# Patient Record
Sex: Female | Born: 1950 | Race: White | Hispanic: No | Smoking: Current every day smoker
Health system: Southern US, Community
[De-identification: ages and names within clinical notes are randomized; demographics above are authoritative.]

## PROBLEM LIST (undated history)

## (undated) DIAGNOSIS — K219 Gastro-esophageal reflux disease without esophagitis: Secondary | ICD-10-CM

## (undated) DIAGNOSIS — F32A Depression, unspecified: Secondary | ICD-10-CM

## (undated) DIAGNOSIS — Z8709 Personal history of other diseases of the respiratory system: Secondary | ICD-10-CM

## (undated) DIAGNOSIS — I2699 Other pulmonary embolism without acute cor pulmonale: Secondary | ICD-10-CM

## (undated) DIAGNOSIS — F329 Major depressive disorder, single episode, unspecified: Secondary | ICD-10-CM

## (undated) DIAGNOSIS — T7840XA Allergy, unspecified, initial encounter: Secondary | ICD-10-CM

## (undated) DIAGNOSIS — D689 Coagulation defect, unspecified: Secondary | ICD-10-CM

## (undated) DIAGNOSIS — E039 Hypothyroidism, unspecified: Secondary | ICD-10-CM

## (undated) DIAGNOSIS — M797 Fibromyalgia: Secondary | ICD-10-CM

## (undated) DIAGNOSIS — E785 Hyperlipidemia, unspecified: Secondary | ICD-10-CM

## (undated) DIAGNOSIS — R609 Edema, unspecified: Secondary | ICD-10-CM

## (undated) DIAGNOSIS — L309 Dermatitis, unspecified: Secondary | ICD-10-CM

## (undated) DIAGNOSIS — F99 Mental disorder, not otherwise specified: Secondary | ICD-10-CM

## (undated) DIAGNOSIS — G5600 Carpal tunnel syndrome, unspecified upper limb: Secondary | ICD-10-CM

## (undated) DIAGNOSIS — J45909 Unspecified asthma, uncomplicated: Secondary | ICD-10-CM

## (undated) DIAGNOSIS — G894 Chronic pain syndrome: Secondary | ICD-10-CM

## (undated) DIAGNOSIS — L709 Acne, unspecified: Secondary | ICD-10-CM

## (undated) HISTORY — PX: TUBAL LIGATION: SHX77

## (undated) HISTORY — PX: COLONOSCOPY: SHX174

## (undated) HISTORY — DX: Carpal tunnel syndrome, unspecified upper limb: G56.00

## (undated) HISTORY — PX: OTHER SURGICAL HISTORY: SHX169

## (undated) HISTORY — PX: TONSILLECTOMY: SUR1361

## (undated) HISTORY — DX: Coagulation defect, unspecified: D68.9

## (undated) HISTORY — PX: UPPER GI ENDOSCOPY: SHX6162

## (undated) HISTORY — DX: Fibromyalgia: M79.7

## (undated) HISTORY — DX: Hyperlipidemia, unspecified: E78.5

## (undated) HISTORY — DX: Mental disorder, not otherwise specified: F99

## (undated) HISTORY — DX: Personal history of other diseases of the respiratory system: Z87.09

## (undated) HISTORY — DX: Major depressive disorder, single episode, unspecified: F32.9

## (undated) HISTORY — DX: Acne, unspecified: L70.9

## (undated) HISTORY — DX: Edema, unspecified: R60.9

## (undated) HISTORY — DX: Chronic pain syndrome: G89.4

## (undated) HISTORY — DX: Unspecified asthma, uncomplicated: J45.909

## (undated) HISTORY — DX: Depression, unspecified: F32.A

## (undated) HISTORY — DX: Gastro-esophageal reflux disease without esophagitis: K21.9

## (undated) HISTORY — DX: Dermatitis, unspecified: L30.9

## (undated) HISTORY — DX: Hypothyroidism, unspecified: E03.9

## (undated) HISTORY — DX: Allergy, unspecified, initial encounter: T78.40XA

---

## 1998-06-24 DIAGNOSIS — I2699 Other pulmonary embolism without acute cor pulmonale: Secondary | ICD-10-CM

## 1998-06-24 HISTORY — DX: Other pulmonary embolism without acute cor pulmonale: I26.99

## 1999-07-03 ENCOUNTER — Other Ambulatory Visit: Admission: RE | Admit: 1999-07-03 | Discharge: 1999-07-03 | Payer: Self-pay | Admitting: *Deleted

## 1999-07-27 ENCOUNTER — Encounter (INDEPENDENT_AMBULATORY_CARE_PROVIDER_SITE_OTHER): Payer: Self-pay

## 1999-07-27 ENCOUNTER — Ambulatory Visit (HOSPITAL_COMMUNITY): Admission: RE | Admit: 1999-07-27 | Discharge: 1999-07-27 | Payer: Self-pay | Admitting: Obstetrics and Gynecology

## 1999-08-02 ENCOUNTER — Encounter: Admission: RE | Admit: 1999-08-02 | Discharge: 1999-08-02 | Payer: Self-pay | Admitting: Family Medicine

## 1999-08-02 ENCOUNTER — Encounter: Payer: Self-pay | Admitting: Family Medicine

## 1999-08-03 ENCOUNTER — Inpatient Hospital Stay (HOSPITAL_COMMUNITY): Admission: RE | Admit: 1999-08-03 | Discharge: 1999-08-06 | Payer: Self-pay | Admitting: Family Medicine

## 1999-08-06 ENCOUNTER — Encounter: Payer: Self-pay | Admitting: Family Medicine

## 1999-08-06 ENCOUNTER — Encounter: Payer: Self-pay | Admitting: Pulmonary Disease

## 2000-01-16 ENCOUNTER — Ambulatory Visit (HOSPITAL_COMMUNITY): Admission: RE | Admit: 2000-01-16 | Discharge: 2000-01-16 | Payer: Self-pay | Admitting: Family Medicine

## 2000-01-16 ENCOUNTER — Encounter: Payer: Self-pay | Admitting: Family Medicine

## 2000-01-24 ENCOUNTER — Encounter: Admission: RE | Admit: 2000-01-24 | Discharge: 2000-02-29 | Payer: Self-pay | Admitting: Family Medicine

## 2000-07-28 ENCOUNTER — Other Ambulatory Visit: Admission: RE | Admit: 2000-07-28 | Discharge: 2000-07-28 | Payer: Self-pay | Admitting: *Deleted

## 2001-07-28 ENCOUNTER — Ambulatory Visit (HOSPITAL_COMMUNITY): Admission: RE | Admit: 2001-07-28 | Discharge: 2001-07-28 | Payer: Self-pay | Admitting: Neurosurgery

## 2001-07-28 ENCOUNTER — Encounter: Payer: Self-pay | Admitting: Neurosurgery

## 2001-10-10 ENCOUNTER — Encounter: Payer: Self-pay | Admitting: Rheumatology

## 2001-10-10 ENCOUNTER — Ambulatory Visit (HOSPITAL_COMMUNITY): Admission: RE | Admit: 2001-10-10 | Discharge: 2001-10-10 | Payer: Self-pay | Admitting: Rheumatology

## 2001-11-05 ENCOUNTER — Encounter: Admission: RE | Admit: 2001-11-05 | Discharge: 2002-02-03 | Payer: Self-pay

## 2001-12-10 ENCOUNTER — Other Ambulatory Visit: Admission: RE | Admit: 2001-12-10 | Discharge: 2001-12-10 | Payer: Self-pay | Admitting: Obstetrics and Gynecology

## 2002-01-19 ENCOUNTER — Other Ambulatory Visit: Admission: RE | Admit: 2002-01-19 | Discharge: 2002-01-19 | Payer: Self-pay | Admitting: *Deleted

## 2002-06-29 ENCOUNTER — Other Ambulatory Visit: Admission: RE | Admit: 2002-06-29 | Discharge: 2002-06-29 | Payer: Self-pay | Admitting: Obstetrics and Gynecology

## 2002-07-22 ENCOUNTER — Emergency Department (HOSPITAL_COMMUNITY): Admission: EM | Admit: 2002-07-22 | Discharge: 2002-07-22 | Payer: Self-pay | Admitting: Emergency Medicine

## 2002-07-22 ENCOUNTER — Encounter: Payer: Self-pay | Admitting: Emergency Medicine

## 2003-01-25 ENCOUNTER — Other Ambulatory Visit: Admission: RE | Admit: 2003-01-25 | Discharge: 2003-01-25 | Payer: Self-pay | Admitting: Obstetrics and Gynecology

## 2003-06-16 ENCOUNTER — Emergency Department (HOSPITAL_COMMUNITY): Admission: EM | Admit: 2003-06-16 | Discharge: 2003-06-16 | Payer: Self-pay | Admitting: *Deleted

## 2003-07-07 ENCOUNTER — Encounter: Payer: Self-pay | Admitting: Emergency Medicine

## 2003-07-08 ENCOUNTER — Inpatient Hospital Stay (HOSPITAL_COMMUNITY): Admission: AD | Admit: 2003-07-08 | Discharge: 2003-07-10 | Payer: Self-pay | Admitting: Psychiatry

## 2003-12-12 ENCOUNTER — Other Ambulatory Visit: Admission: RE | Admit: 2003-12-12 | Discharge: 2003-12-12 | Payer: Self-pay | Admitting: Obstetrics and Gynecology

## 2004-07-03 ENCOUNTER — Other Ambulatory Visit: Admission: RE | Admit: 2004-07-03 | Discharge: 2004-07-03 | Payer: Self-pay | Admitting: Obstetrics and Gynecology

## 2005-10-19 ENCOUNTER — Emergency Department (HOSPITAL_COMMUNITY): Admission: EM | Admit: 2005-10-19 | Discharge: 2005-10-19 | Payer: Self-pay | Admitting: Emergency Medicine

## 2008-07-27 ENCOUNTER — Encounter: Admission: RE | Admit: 2008-07-27 | Discharge: 2008-07-27 | Payer: Self-pay | Admitting: Gastroenterology

## 2010-05-24 ENCOUNTER — Encounter: Admission: RE | Admit: 2010-05-24 | Discharge: 2010-05-24 | Payer: Self-pay | Admitting: Internal Medicine

## 2010-11-09 NOTE — Op Note (Signed)
[facility]  Patient:    Morgan Kelly                   MRN: [id number] Proc. Date: 07/27/99 Adm. Date:  [id number] Disc. Date: [id number] Attending:  Morene Antu                           Operative Report  PREOPERATIVE DIAGNOSIS:       Desire for sterilization, irregular bleeding.  POSTOPERATIVE DIAGNOSIS:      Same.  PROCEDURE:                    Laparoscopic tubal cautery and endometrial biopsy.  SURGEON:                      Sherry A. Rosalio Macadamia, M.D.  ANESTHESIA:                   General.  INDICATIONS:                  This is a 60 year old G1, P0-0-1-0 woman who has ad very irregular bleeding over the past few months.  The patient requests permanent sterilization procedure.  She knows the alternatives of this procedure and the risks of the procedure as well as the failure rate.  FINDINGS:                     Normal fallopian tubes and ovary.  The right fallopian tube with approximately 1 cm hydatid cyst of Morgagni.  Right ovary with corpus luteum cyst. Uterus:  Retroverted approximately 9-10 weeks size, irregular with fibroids present.  Normal appendix.  Normal upper abdominal exploration.  PROCEDURE:                    The patient was brought to the operating room and  given adequate general anesthesia.  She was placed in the dorsal lithotomy position.  The abdomen, perineum and vagina were washed with Betadine.  The bladder was in-and-out catheterized.  The patient was draped in a sterile fashion. A speculum was placed within the vagina.  The cervix was grasped with a single-tooth tenaculum.  Endometrial biopsy was performed with a pipelle.  A single tooth Hulka tenaculum was placed in the endocervical canal in a retroverted fashion and rotated around.  The first tenaculum and speculum were removed.  The patient was then completely draped in a sterile fashion.  A subumbilical incision was made. A Veress needle  was introduced in the peritoneal cavity.  Its placement was checked with saline.  Approximately 3 L of carbon dioxide was insufflated.  The  Veress needle was then removed.  Laparoscopic trocar was easily introduced in the peritoneal cavity.  Positive identification of pelvic organs were made.  A suprapubic incision was made and, under direct visualization, suprapubic trocar was placed.  The left fallopian tube was then grasped in the isthmic ampullary portion and cauterized over approximately 2-3 cm.  At least 1 cm normal fallopian tube as present between the cauterized portion and cornua.  The same procedure was performed on the right.  The pelvis was inspected.  The hydatid was attempted to be cauterized.  However, it could not be grasped in a safe fashion to be able to cauterize it.  The cul-de-sac was inspected with no endometriosis or abnormalities seen.  Pictures were obtained of the pelvis.  The  appendix was visualized and felt to be normal.  The upper abdomen was visualized and felt to be normal.  All carbon dioxide was allowed to escape.  The laparoscope was removed under direct visualization.  The suprapubic sleeve was removed after all carbon dioxide had een released.  The incisions were infiltrated with 0.25% Marcaine.  The subumbilical incision was closed deeply with 0 Vicryl.  Skin incisions were closed with 3-0 Vicryl in a subcuticular stitches.  Band-aids were placed on the wounds and Hulka tenaculum was removed from the vagina.  The patient was taken out of the dorsal  lithotomy position.  The patient was awakened.  She was extubated.  She was moved from the operating table to a stretcher in stable condition.  Complications were none.  Estimated blood loss less than 5 cc. DD:  07/27/99 TD:  07/27/99 Job: [id number]
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
## 2011-06-12 ENCOUNTER — Ambulatory Visit (INDEPENDENT_AMBULATORY_CARE_PROVIDER_SITE_OTHER): Payer: Medicare Other | Admitting: Internal Medicine

## 2011-06-12 DIAGNOSIS — M545 Low back pain, unspecified: Secondary | ICD-10-CM

## 2011-06-12 DIAGNOSIS — M25559 Pain in unspecified hip: Secondary | ICD-10-CM

## 2011-06-12 DIAGNOSIS — R071 Chest pain on breathing: Secondary | ICD-10-CM

## 2011-06-12 LAB — LIPID PANEL
Cholesterol: 196 mg/dL (ref 0–200)
HDL: 60 mg/dL (ref 35–70)
LDL Cholesterol: 105 mg/dL
Triglycerides: 156 mg/dL (ref 40–160)

## 2011-06-26 ENCOUNTER — Other Ambulatory Visit: Payer: Self-pay | Admitting: Internal Medicine

## 2011-06-26 DIAGNOSIS — M542 Cervicalgia: Secondary | ICD-10-CM

## 2011-06-26 DIAGNOSIS — M549 Dorsalgia, unspecified: Secondary | ICD-10-CM

## 2011-06-27 ENCOUNTER — Other Ambulatory Visit: Payer: Self-pay | Admitting: Internal Medicine

## 2011-06-27 DIAGNOSIS — M549 Dorsalgia, unspecified: Secondary | ICD-10-CM

## 2011-07-10 ENCOUNTER — Ambulatory Visit: Payer: Medicare Other | Admitting: Internal Medicine

## 2011-07-11 ENCOUNTER — Other Ambulatory Visit: Payer: Self-pay

## 2011-07-11 ENCOUNTER — Inpatient Hospital Stay: Admission: RE | Admit: 2011-07-11 | Payer: Self-pay | Source: Ambulatory Visit

## 2011-07-19 ENCOUNTER — Encounter: Payer: Self-pay | Admitting: Family Medicine

## 2011-07-29 ENCOUNTER — Encounter: Payer: Self-pay | Admitting: Family Medicine

## 2011-07-31 ENCOUNTER — Ambulatory Visit: Payer: Medicare Other | Admitting: Internal Medicine

## 2011-07-31 ENCOUNTER — Telehealth: Payer: Self-pay

## 2011-07-31 NOTE — Telephone Encounter (Signed)
PT STATES THAT HER ZANAX IS DUE AND SHE NEEDS THE REFILL HAS HAD A HARD TIME SLEEPING LATELY

## 2011-08-01 ENCOUNTER — Other Ambulatory Visit: Payer: Self-pay | Admitting: Internal Medicine

## 2011-08-01 ENCOUNTER — Other Ambulatory Visit: Payer: Self-pay

## 2011-08-01 NOTE — Telephone Encounter (Signed)
PT. IS CALLING BACK REGARDING HER XANAX RX.

## 2011-08-02 NOTE — Telephone Encounter (Signed)
CHART AT CLINICAL TL

## 2011-08-02 NOTE — Telephone Encounter (Signed)
PT CALLED AGAIN REGARDING HER XANAX. PLEASE CALL PT AT [phone] PHARMACY IS [facility]

## 2011-08-02 NOTE — Telephone Encounter (Signed)
LMOM notifying patient that rx was called in. 

## 2011-08-02 NOTE — Telephone Encounter (Signed)
Please pull paper chart and give to PA's

## 2011-08-05 ENCOUNTER — Telehealth: Payer: Self-pay

## 2011-08-05 NOTE — Telephone Encounter (Signed)
Message copied by Kerney Elbe on Mon Aug 05, 2011 12:01 PM ------      Message from: Tonye Pearson      Created: Sun Aug 04, 2011 10:47 PM       Pull chart

## 2011-08-06 ENCOUNTER — Telehealth: Payer: Self-pay

## 2011-08-06 DIAGNOSIS — G8929 Other chronic pain: Secondary | ICD-10-CM

## 2011-08-06 NOTE — Telephone Encounter (Signed)
Chart in box-Dr Merla Riches

## 2011-08-06 NOTE — Telephone Encounter (Signed)
.  [facility] PT HAVE RECEIVED ALL HER MEDS FROM [facility], BUT IN NEED OF HER OXYCODONE. PLEASE CALL [phone] WHEN READY FOR PICK UP AND SHE NEED IT BY Wednesday OR Thursday

## 2011-08-07 MED ORDER — OXYCODONE HCL 30 MG PO TABS: 30.0000 mg | ORAL_TABLET | ORAL | Status: AC | PRN

## 2011-08-07 NOTE — Telephone Encounter (Signed)
Refilled #[id number] 

## 2011-08-07 NOTE — Telephone Encounter (Signed)
Dr Merla Riches, pts chart is in your box.

## 2011-08-07 NOTE — Telephone Encounter (Signed)
Called pt and advised RX ready for pick up  

## 2011-08-29 ENCOUNTER — Other Ambulatory Visit: Payer: Self-pay | Admitting: Internal Medicine

## 2011-09-04 ENCOUNTER — Encounter: Payer: Self-pay | Admitting: Internal Medicine

## 2011-09-04 ENCOUNTER — Ambulatory Visit (INDEPENDENT_AMBULATORY_CARE_PROVIDER_SITE_OTHER): Payer: Medicare Other | Admitting: Internal Medicine

## 2011-09-04 VITALS — BP 110/46 | HR 62 | Temp 98.4°F | Resp 16 | Ht 61.0 in

## 2011-09-04 DIAGNOSIS — M797 Fibromyalgia: Secondary | ICD-10-CM

## 2011-09-04 DIAGNOSIS — F341 Dysthymic disorder: Secondary | ICD-10-CM

## 2011-09-04 DIAGNOSIS — F39 Unspecified mood [affective] disorder: Secondary | ICD-10-CM

## 2011-09-04 DIAGNOSIS — R7989 Other specified abnormal findings of blood chemistry: Secondary | ICD-10-CM

## 2011-09-04 DIAGNOSIS — F4310 Post-traumatic stress disorder, unspecified: Secondary | ICD-10-CM

## 2011-09-04 DIAGNOSIS — R609 Edema, unspecified: Secondary | ICD-10-CM

## 2011-09-04 DIAGNOSIS — G8929 Other chronic pain: Secondary | ICD-10-CM

## 2011-09-04 DIAGNOSIS — J45998 Other asthma: Secondary | ICD-10-CM

## 2011-09-04 DIAGNOSIS — F339 Major depressive disorder, recurrent, unspecified: Secondary | ICD-10-CM

## 2011-09-04 DIAGNOSIS — L709 Acne, unspecified: Secondary | ICD-10-CM

## 2011-09-04 DIAGNOSIS — F411 Generalized anxiety disorder: Secondary | ICD-10-CM

## 2011-09-04 DIAGNOSIS — F32A Depression, unspecified: Secondary | ICD-10-CM

## 2011-09-04 DIAGNOSIS — E785 Hyperlipidemia, unspecified: Secondary | ICD-10-CM

## 2011-09-04 DIAGNOSIS — L309 Dermatitis, unspecified: Secondary | ICD-10-CM

## 2011-09-04 DIAGNOSIS — R945 Abnormal results of liver function studies: Secondary | ICD-10-CM

## 2011-09-04 DIAGNOSIS — E039 Hypothyroidism, unspecified: Secondary | ICD-10-CM

## 2011-09-04 DIAGNOSIS — F329 Major depressive disorder, single episode, unspecified: Secondary | ICD-10-CM

## 2011-09-04 MED ORDER — FLUTICASONE-SALMETEROL 250-50 MCG/DOSE IN AEPB: 1.0000 | INHALATION_SPRAY | Freq: Two times a day (BID) | RESPIRATORY_TRACT | Status: DC

## 2011-09-04 MED ORDER — ALPRAZOLAM 1 MG PO TABS: 1.0000 mg | ORAL_TABLET | Freq: Three times a day (TID) | ORAL | Status: DC | PRN

## 2011-09-04 MED ORDER — CITALOPRAM HYDROBROMIDE 40 MG PO TABS: 40.0000 mg | ORAL_TABLET | Freq: Every day | ORAL | Status: DC

## 2011-09-04 MED ORDER — LEVOTHYROXINE SODIUM 175 MCG PO TABS: 175.0000 ug | ORAL_TABLET | Freq: Every day | ORAL | Status: DC

## 2011-09-04 MED ORDER — ALBUTEROL SULFATE HFA 108 (90 BASE) MCG/ACT IN AERS: 2.0000 | INHALATION_SPRAY | Freq: Four times a day (QID) | RESPIRATORY_TRACT | Status: DC | PRN

## 2011-09-04 MED ORDER — OXYCODONE HCL 30 MG PO TABS: 30.0000 mg | ORAL_TABLET | Freq: Four times a day (QID) | ORAL | Status: AC | PRN

## 2011-09-04 MED ORDER — LIDOCAINE 5 % EX PTCH: MEDICATED_PATCH | CUTANEOUS | Status: DC

## 2011-09-04 MED ORDER — SPIRONOLACTONE 100 MG PO TABS: 100.0000 mg | ORAL_TABLET | Freq: Every day | ORAL | Status: DC

## 2011-09-05 DIAGNOSIS — F411 Generalized anxiety disorder: Secondary | ICD-10-CM | POA: Insufficient documentation

## 2011-09-05 DIAGNOSIS — G8929 Other chronic pain: Secondary | ICD-10-CM | POA: Insufficient documentation

## 2011-09-05 DIAGNOSIS — J45998 Other asthma: Secondary | ICD-10-CM | POA: Insufficient documentation

## 2011-09-05 DIAGNOSIS — F339 Major depressive disorder, recurrent, unspecified: Secondary | ICD-10-CM | POA: Insufficient documentation

## 2011-09-05 DIAGNOSIS — L309 Dermatitis, unspecified: Secondary | ICD-10-CM | POA: Insufficient documentation

## 2011-09-05 DIAGNOSIS — F4310 Post-traumatic stress disorder, unspecified: Secondary | ICD-10-CM | POA: Insufficient documentation

## 2011-09-05 DIAGNOSIS — L709 Acne, unspecified: Secondary | ICD-10-CM | POA: Insufficient documentation

## 2011-09-05 DIAGNOSIS — E785 Hyperlipidemia, unspecified: Secondary | ICD-10-CM | POA: Insufficient documentation

## 2011-09-05 DIAGNOSIS — E039 Hypothyroidism, unspecified: Secondary | ICD-10-CM | POA: Insufficient documentation

## 2011-09-05 DIAGNOSIS — M797 Fibromyalgia: Secondary | ICD-10-CM | POA: Insufficient documentation

## 2011-09-05 DIAGNOSIS — F39 Unspecified mood [affective] disorder: Secondary | ICD-10-CM | POA: Insufficient documentation

## 2011-09-05 DIAGNOSIS — R609 Edema, unspecified: Secondary | ICD-10-CM | POA: Insufficient documentation

## 2011-09-05 DIAGNOSIS — R945 Abnormal results of liver function studies: Secondary | ICD-10-CM | POA: Insufficient documentation

## 2011-09-05 NOTE — Progress Notes (Signed)
  Subjective:    Patient ID: Morgan Kelly, female    DOB: Oct 03, 1950, 61 y.o.   MRN: [id number]  HPI Patient Active Problem List  Diagnoses  . Chronic pain  . Asthma in remission  . Edema  . GAD (generalized anxiety disorder)  . Depression  . Hypothyroid  . Acne  . Other and unspecified hyperlipidemia  . Fibromyalgia  . Eczema  . Mood disorder  . PTSD (post-traumatic stress disorder)  . Depression, major, recurrent  . Abnormal LFTs  acne improve dramatically while she was on Keflex for something else, and her gastritis symptoms resolved as well. This is not a medication That would agree to continue long-term She is here for medication refills Reevaluation of her chronic pain his pain being as she has postponed her MRIs of neck and lower back for 3 or 4 months. Her mother recently died and she found out about it by going to visit her a day later at the nursing home,She is still estranged from her sister who was in charge     Review of Systems stable at the current time     Objective:   Physical Exam Vital signs are stable Neurological is intact and mood is stable today with no tears       Assessment & Plan:   Patient Active Problem List  Diagnoses  . Chronic pain  . Asthma in remission  . Edema  . GAD (generalized anxiety disorder)  . Depression  . Hypothyroid  . Acne  . Other and unspecified hyperlipidemia  . Fibromyalgia  . Eczema  . Mood disorder  . PTSD (post-traumatic stress disorder)  . Depression, major, recurrent  . Abnormal LFTs   Meds were refilled as noted in notes and orders She is now on 30 mg of oxycodone Every 6 hours dispense 120 and she will need a new prescription one month Xanax 1 mg 3 times a day was refilled to extend the full 6 months along with the other medicines Followup will be after MRIs She continues to neglect GI and GYN evaluations

## 2011-10-01 ENCOUNTER — Telehealth: Payer: Self-pay

## 2011-10-02 NOTE — Telephone Encounter (Signed)
Her next prescription for oxycodone is due on April 13 Her next appointment with me is on April 17 Would she like to pick up a prescription for the next month on the 12th/what does she have enough to last until the appointment

## 2011-10-03 MED ORDER — OXYCODONE HCL 30 MG PO TABS: 30.0000 mg | ORAL_TABLET | Freq: Four times a day (QID) | ORAL | Status: AC | PRN

## 2011-10-03 NOTE — Telephone Encounter (Signed)
Called [facility] back and was told pt p/up Rx last month on 3/13, which would mean her RF would be due tomorrow the 12th since there are 31 days in March. Can you get her Rx ready for her to  P/up for fill tomorrow?

## 2011-10-03 NOTE — Telephone Encounter (Signed)
LMOM that Rx is ready for P/up

## 2011-10-03 NOTE — Telephone Encounter (Signed)
Oxycodone 30 mg Q 6 hours #120 written to be picked up 10/04/11, refilled for hand parethesias per Dr. Vicenta Aly last OV note.

## 2011-10-08 ENCOUNTER — Other Ambulatory Visit: Payer: Self-pay | Admitting: Internal Medicine

## 2011-10-09 ENCOUNTER — Ambulatory Visit: Payer: Medicare Other | Admitting: Internal Medicine

## 2011-10-10 ENCOUNTER — Telehealth: Payer: Self-pay

## 2011-10-10 DIAGNOSIS — IMO0002 Reserved for concepts with insufficient information to code with codable children: Secondary | ICD-10-CM

## 2011-10-10 NOTE — Telephone Encounter (Signed)
Pt would ike Lupita Leash to give her a call regarding MRI referral.

## 2011-10-23 ENCOUNTER — Ambulatory Visit
Admission: RE | Admit: 2011-10-23 | Discharge: 2011-10-23 | Disposition: A | Discharge status: Home / Self Care | Payer: Medicare Other | Source: Ambulatory Visit | Attending: Internal Medicine | Admitting: Internal Medicine

## 2011-10-23 DIAGNOSIS — IMO0002 Reserved for concepts with insufficient information to code with codable children: Secondary | ICD-10-CM

## 2011-10-25 ENCOUNTER — Telehealth: Payer: Self-pay | Admitting: Internal Medicine

## 2011-10-25 NOTE — Telephone Encounter (Signed)
Discussed by phone the results of her MRI scans/she is to see me next Wednesday for further evaluation to include a chest x-ray and other considerations for underlying cancers

## 2011-10-30 ENCOUNTER — Ambulatory Visit: Payer: Medicare Other

## 2011-10-30 ENCOUNTER — Ambulatory Visit (INDEPENDENT_AMBULATORY_CARE_PROVIDER_SITE_OTHER): Payer: Medicare Other | Admitting: Internal Medicine

## 2011-10-30 ENCOUNTER — Encounter: Payer: Self-pay | Admitting: Internal Medicine

## 2011-10-30 VITALS — BP 100/61 | HR 65 | Temp 97.9°F | Resp 20

## 2011-10-30 DIAGNOSIS — L702 Acne varioliformis: Secondary | ICD-10-CM

## 2011-10-30 DIAGNOSIS — R7989 Other specified abnormal findings of blood chemistry: Secondary | ICD-10-CM

## 2011-10-30 DIAGNOSIS — C799 Secondary malignant neoplasm of unspecified site: Secondary | ICD-10-CM

## 2011-10-30 DIAGNOSIS — M546 Pain in thoracic spine: Secondary | ICD-10-CM

## 2011-10-30 DIAGNOSIS — Z720 Tobacco use: Secondary | ICD-10-CM | POA: Insufficient documentation

## 2011-10-30 DIAGNOSIS — E039 Hypothyroidism, unspecified: Secondary | ICD-10-CM

## 2011-10-30 DIAGNOSIS — C801 Malignant (primary) neoplasm, unspecified: Secondary | ICD-10-CM

## 2011-10-30 DIAGNOSIS — R945 Abnormal results of liver function studies: Secondary | ICD-10-CM

## 2011-10-30 DIAGNOSIS — G8929 Other chronic pain: Secondary | ICD-10-CM

## 2011-10-30 DIAGNOSIS — R748 Abnormal levels of other serum enzymes: Secondary | ICD-10-CM

## 2011-10-30 LAB — C-REACTIVE PROTEIN: CRP: 0.25 mg/dL (ref <0.60)

## 2011-10-30 LAB — CBC WITH DIFFERENTIAL/PLATELET
Eosinophils Absolute: 0.1 10*3/uL (ref 0.0–0.7)
Hemoglobin: 14.7 g/dL (ref 12.0–15.0)
Lymphs Abs: 1.7 10*3/uL (ref 0.7–4.0)
MCH: 34.3 pg — ABNORMAL HIGH (ref 26.0–34.0)
Monocytes Relative: 8 % (ref 3–12)
Neutro Abs: 4.6 10*3/uL (ref 1.7–7.7)
Neutrophils Relative %: 66 % (ref 43–77)
Platelets: 251 10*3/uL (ref 150–400)
RBC: 4.28 MIL/uL (ref 3.87–5.11)
WBC: 6.9 10*3/uL (ref 4.0–10.5)

## 2011-10-30 LAB — COMPREHENSIVE METABOLIC PANEL
BUN: 11 mg/dL (ref 6–23)
CO2: 23 mEq/L (ref 19–32)
Calcium: 9.5 mg/dL (ref 8.4–10.5)
Chloride: 104 mEq/L (ref 96–112)
Creat: 0.92 mg/dL (ref 0.50–1.10)
Glucose, Bld: 91 mg/dL (ref 70–99)
Total Bilirubin: 0.3 mg/dL (ref 0.3–1.2)

## 2011-10-30 LAB — TSH: TSH: 10.286 u[IU]/mL — ABNORMAL HIGH (ref 0.350–4.500)

## 2011-10-30 MED ORDER — OXYCODONE HCL 15 MG PO TABS: 15.0000 mg | ORAL_TABLET | Freq: Four times a day (QID) | ORAL | Status: AC | PRN

## 2011-10-30 MED ORDER — OXYCODONE HCL 30 MG PO TABS: ORAL_TABLET | ORAL | Status: DC

## 2011-10-30 MED ORDER — CEPHALEXIN 500 MG PO CAPS: 500.0000 mg | ORAL_CAPSULE | Freq: Four times a day (QID) | ORAL | Status: AC

## 2011-10-30 NOTE — Progress Notes (Addendum)
Subjective:    Patient ID: Morgan Kelly, female    DOB: [birth year], 61 y.o.   MRN: [id number]  HPIHere for followup of several problems including A new one MRI of her C-spine and lumbar spine as evaluation for chronic pain revealed an abnormal signal at T1 consistent with a metastasis or multiple myeloma.She is a chronic smoker. She has had a sympathectomy for rhinologist many years ago. She has had an abnormal Pap and was lost to followup. She reports a normal mammogram 2 years ago. she had upper endoscopy in 2010 which was wnl Except for small hiatal hernia, gastritis, and the question of a dilated esophagus with narrowing at the GE junction. Her colonoscopy was aborted because of poor prep and pain.  Patient Active Problem List  Diagnoses  . Chronic pain-She continues to complain of chronic pain which prevents activity and sleep/she now takes 30 mg of oxycodone in the morning and at bedtime and 15 mg every 4 hours during the day/she has trouble cutting the 30 mg pills in half  . Asthma in remission Despite her continued smoking but it requires Singulair and occasional inhalers  . Edema  is not a current problem  . GAD (generalized anxiety disorder) Requires 3 doses of Xanax a day  . Depression  . Hypothyroid Labs in December were normal at this dose of replacement and will be rechecked today. She has no symptoms.  . Acne-She has had a flare and would like to go back on Keflex which seems to work  . Other and unspecified hyperlipidemia  . Fibromyalgia-This is certainly part of her chronic pain syndrome and has been present since before she was a patient of mine  . Eczema  . Mood disorder  . PTSD (post-traumatic stress disorder)  . Depression, major, recurrent  . Abnormal LFTs Were normal in December 2012/is not sure whether this is secondary to alcohol or to steatosis  . Nicotine abuse   From a psychological standpoint her life is terrible. She is estranged from her only close  relative, her sister. Her mother died within the last year. Many of her relatives are unstable from a psychological standpoint and  at least one committed suicide, so she has no real stable relationships.    Chronic back and neck pain-her last studies that tried imaging in 2010 revealed multilevel chronic changes without a surgical lesion. The MRI of the thoracic spine showed degenerative changes  But nothing like the current study. Review of Systems     Objective:   Physical ExamVital signs are stable/she refuses weight but has no weight loss by history No thyromegaly or nodules No cervical or supraclavicular adenopathy Lungs are clear Heart is regular without murmurs rubs clicks or gallops The abdomen is soft nontender nondistended with no organomegaly or masses Extremities have no edema Skin continues to exhibit is a hyperpigmented macular confluent lesions over her lower back( she missed her dermatology referral)-She also has active facial acne MS-She is tender to palpation over the upper thoracic spine and the left parascapular border as well as  Bilaterally in the lumbar area/neck range of motion creates discomfort in the neck without radicular symptoms Neurological is intact Mood is appropriately anxious today/in addition she is intermittently tearful describing her current life       [facility] reading (PRIMARY) by  Dr. Tavi Hoogendoorn=Chest x-ray and T-spine reveal no defects to suggest metastases or primary cancer lesion which could have caused the abnormal appearance of T1 vertebral body on CT scan  Assessment & Plan:  Problem #1 unclear abnormality at T1 on the MRI-need to rule out metastasis and myeloma Problem #2 Acne-Keflex Problem #3 chronic pain syndrome-oxycodone refilled for one month Once her medical problems are stable we will consider referral for chronic pain management Problem #4 chronic psychological problems with recurrent depression, generalized anxiety, PTSD,  substance abuse history, and mood disorder, with probable borderline personality disorder. She will continue with medications as prescribed-she refuses psychiatry referral Problem #5 history of asthma/nicotine addiction Problem #6 hypothyroidism  Meds ordered this encounter  Medications  . oxyCODONE (ROXICODONE) 15 MG immediate release tablet    Sig: Take 1 tablet (15 mg total) by mouth every 6 (six) hours as needed for pain.    Dispense:  120 tablet    Refill:  0  . oxycodone (ROXICODONE) 30 MG immediate release tablet    Sig: One in am and one hs    Dispense:  60 tablet    Refill:  0  . cephALEXin (KEFLEX) 500 MG capsule    Sig: Take 1 capsule (500 mg total) by mouth 4 (four) times daily.    Dispense:  40 capsule    Refill:  0   Labs were ordered to include CBC, CRP, metabolic profile, TSH-will contact about next step in workup  Addendum 11/04/2011 Labs have an elevation of one liver enzyme plus alkaline phosphatase in addition to indicating the need for more Synthroid hormone I am trying to schedule an abdominal ultrasound to be sure the liver is not the source of her lesion at T1 but Medicare is denying coverage. I'll ask our clinical desk to investigate further

## 2011-10-31 ENCOUNTER — Other Ambulatory Visit: Payer: Self-pay | Admitting: Internal Medicine

## 2011-11-02 ENCOUNTER — Other Ambulatory Visit: Payer: Self-pay | Admitting: Internal Medicine

## 2011-11-04 ENCOUNTER — Encounter: Payer: Self-pay | Admitting: Internal Medicine

## 2011-11-04 MED ORDER — LEVOTHYROXINE SODIUM 200 MCG PO TABS: 200.0000 ug | ORAL_TABLET | Freq: Every day | ORAL | Status: DC

## 2011-11-04 NOTE — Progress Notes (Signed)
Addended by: Tonye Pearson on: 11/04/2011 04:39 PM   Modules accepted: Orders

## 2011-11-05 ENCOUNTER — Telehealth: Payer: Self-pay | Admitting: Radiology

## 2011-11-05 DIAGNOSIS — F411 Generalized anxiety disorder: Secondary | ICD-10-CM

## 2011-11-05 DIAGNOSIS — R937 Abnormal findings on diagnostic imaging of other parts of musculoskeletal system: Secondary | ICD-10-CM

## 2011-11-05 NOTE — Telephone Encounter (Signed)
Message copied by Luretha Murphy on Tue Nov 05, 2011  1:13 PM ------      Message from: Bronson Curb      Created: Tue Nov 05, 2011 11:34 AM                   ----- Message -----         From: Blima Ledger, CMA         Sent: 11/05/2011  11:22 AM           To: [facility]                        ----- Message -----         From: Tonye Pearson, MD         Sent: 11/04/2011   4:40 PM           To: [facility] Clinical Message Pool            Please see the addendum to the last office visit which have rather DU which indicates that Medicare is refusing coverage for a right upper quadrant ultrasound in this patient with abnormal liver function studies and the need to rule out a source for a metastasis to the T1 vertebra/is there anything you can do to understand how he might get this test to be covered

## 2011-11-07 NOTE — Telephone Encounter (Signed)
Dr Merla Riches, I have checked on this [place] Abd for pt and spoken w/Referrals. I see the [place] "pending" under Imaging tab in chart, but it is not in the Referrals tab as an order. It looks like the referral order has never been put in as an order? I checked w/Referrals and they do not have any record of it and verified that Medicare does not require precerts on USs. Your message looks as if you have some knowledge that Medicare denied it though. Please advise me on what you know about this and I will call Medicare if needed.

## 2011-11-11 MED ORDER — ALPRAZOLAM 1 MG PO TABS: 1.0000 mg | ORAL_TABLET | Freq: Three times a day (TID) | ORAL | Status: DC | PRN

## 2011-11-11 NOTE — Telephone Encounter (Signed)
PT WANTING TO KNOW WHY SHE IS HAVING AN ULTRASOUND.  DOES NOT KNOW WHY IT IS INDICATED.

## 2011-11-11 NOTE — Telephone Encounter (Signed)
PT WANTING TO KNOW WHY AN ULTRASOUND IS INDICATED.

## 2011-11-11 NOTE — Telephone Encounter (Signed)
I thought she needed an ultrasound to rule out cancer of the liver but, should she persist with mildly abnormal liver function studies and she has a bony lesion of some kind at T1 Medicare does not agree with me and is not willing to approve this test I will discontinue this order and instead we'll recommend a consult with orthopedics to see if they feel that we need to pursue a diagnosis for the abnormal lesion seen on her MRI

## 2011-11-11 NOTE — Telephone Encounter (Signed)
Dr Merla Riches, were you able to get the [place] ordered? Do you want me to call pt and explain what we had discussed, or do you want to discuss w/pt directly?

## 2011-11-12 ENCOUNTER — Encounter: Payer: Self-pay | Admitting: Internal Medicine

## 2011-11-12 NOTE — Telephone Encounter (Signed)
Let her know I was able to get the ultrasound covered by Medicare

## 2011-11-12 NOTE — Telephone Encounter (Signed)
Pt would like to know why her ultrasound was not covered by medicare and why do she have to se the orthopedics.  She does not want to see ortho.  She wants to know what the ortho can do for her since her liver enzymes are high.

## 2011-11-12 NOTE — Progress Notes (Signed)
Addended by: Tonye Pearson on: 11/12/2011 10:49 PM   Modules accepted: Orders

## 2011-11-12 NOTE — Telephone Encounter (Signed)
LMOM to CB. 

## 2011-11-13 NOTE — Telephone Encounter (Signed)
Notified Morgan Kelly that Dr Merla Riches was able to get the [place] covered and she should be getting a call as to when they can get her scheduled for the test. Morgan Kelly agreed and thanks Dr Merla Riches.

## 2011-11-21 ENCOUNTER — Ambulatory Visit
Admission: RE | Admit: 2011-11-21 | Discharge: 2011-11-21 | Disposition: A | Discharge status: Home / Self Care | Payer: Medicare Other | Source: Ambulatory Visit | Attending: Internal Medicine | Admitting: Internal Medicine

## 2011-11-21 DIAGNOSIS — G8929 Other chronic pain: Secondary | ICD-10-CM

## 2011-11-21 DIAGNOSIS — R945 Abnormal results of liver function studies: Secondary | ICD-10-CM

## 2011-11-21 DIAGNOSIS — C799 Secondary malignant neoplasm of unspecified site: Secondary | ICD-10-CM

## 2011-11-21 DIAGNOSIS — R748 Abnormal levels of other serum enzymes: Secondary | ICD-10-CM

## 2011-11-25 ENCOUNTER — Encounter: Payer: Self-pay | Admitting: Internal Medicine

## 2011-11-27 ENCOUNTER — Ambulatory Visit (INDEPENDENT_AMBULATORY_CARE_PROVIDER_SITE_OTHER): Payer: Medicare Other | Admitting: Internal Medicine

## 2011-11-27 ENCOUNTER — Telehealth: Payer: Self-pay | Admitting: Radiology

## 2011-11-27 ENCOUNTER — Encounter: Payer: Self-pay | Admitting: Internal Medicine

## 2011-11-27 VITALS — BP 100/58 | HR 65 | Temp 98.2°F | Resp 16

## 2011-11-27 DIAGNOSIS — G8929 Other chronic pain: Secondary | ICD-10-CM

## 2011-11-27 DIAGNOSIS — M546 Pain in thoracic spine: Secondary | ICD-10-CM

## 2011-11-27 DIAGNOSIS — C801 Malignant (primary) neoplasm, unspecified: Secondary | ICD-10-CM | POA: Insufficient documentation

## 2011-11-27 MED ORDER — OXYCODONE HCL 15 MG PO TABS: 15.0000 mg | ORAL_TABLET | Freq: Four times a day (QID) | ORAL | Status: AC | PRN

## 2011-11-27 MED ORDER — OXYCODONE HCL 30 MG PO TABS: ORAL_TABLET | ORAL | Status: DC

## 2011-11-27 NOTE — Telephone Encounter (Signed)
Could not reach pt--"number not in service". No other number to contact her by.

## 2011-11-27 NOTE — Telephone Encounter (Signed)
Message copied by Luretha Murphy on Wed Nov 27, 2011  8:46 AM ------      Message from: Tonye Pearson      Created: Mon Nov 25, 2011 10:54 AM       Everlena Cooper and inform her that the ultrasound shows no sign of cancer and shows once again she has fatty infiltration of the liver but no other abnormality.      I would like to refer her to Dr. Yevette Edwards at [facility] orthopedics to evaluate the abnormality seen on MRI scan of the thoracic spine If that's okay with her

## 2011-11-27 NOTE — Progress Notes (Signed)
  Subjective:    Patient ID: Morgan Kelly, female    DOB: [birth year], 61 y.o.   MRN: [id number]  HPI here for F/u Pain hips/stretching-lidocaine Patient Active Problem List  Diagnoses  . Chronic pain-feels beat down by chronic pain  . Asthma in remission  . Edema-Stable  . GAD (generalized anxiety disorder)-Exacerbated by pain     . Hypothyroid  . Acne  . Other and unspecified hyperlipidemia  . Fibromyalgia-Lidoderm patches help, as well as pain meds, but she has trouble in the lumbar muscles and in the shoulder muscles  . Eczema  . Mood disorder  . PTSD (post-traumatic stress disorder)  . Depression, major, recurrent-Exacerbated by living situation, finances, chronic pain, family relationships  . Abnormal LFTs-Secondary to hepatic steatosis  . Nicotine abuse There is the potential for alcohol abuse to be an issue    -  Abnormal MRI T-Spine ? Metastasis--Abd US=no malignancy    Review of Systemsno other new symptoms     Objective:   Physical Exam Vital signs stable She is in moderate distress and has to move around the room to improve her hip and lower back pain She is markedly tender over her posterior cervical upper thoracic and lower lumbar muscles/range of motion creates pain as well  Neurologically she is intact Pupils are equal round reactive to light and accommodation       Assessment & Plan:   1. Chronic pain  oxycodone (ROXICODONE) 30 MG immediate release tablet, oxyCODONE (ROXICODONE) 15 MG immediate release tablet  2. possible metastasis t spine -source unknown  We discussed the next stage in the evaluation as a biopsy of the thoracic spine to find out whether this lesion was cancer. However if it is cancer she will refuse chemotherapy radiation therapy or any kind of surgery and she refuses a biopsy although she would think about a bone scan. She is sure that if she has a cancer she will decide to have no treatment. She is not suicidal and is not  actively thinking about ending her life anyway but has decided that she has had more medical care than she wants to have had, and is ready to " ride it out"  3. Pain in thoracic spine    4.  Multiple psychiatric problems 5.  fibromyalgia Meds ordered this encounter  Medications  . We will try to consider moving her to longer acting pain medication depending on what her insurance will cover/she is not comfortable at her current dose and would like more medication For now will stay the same/she can refill her medications today      . She will continue with the Lidoderm patches which are helpful      . oxycodone (ROXICODONE) 30 MG immediate release tablet    Sig: One in am and one hs    Dispense:  60 tablet    Refill:  0  . oxyCODONE (ROXICODONE) 15 MG immediate release tablet    Sig: Take 1 tablet (15 mg total) by mouth every 6 (six) hours as needed for pain.    Dispense:  120 tablet    Refill:  0  To followup in one month

## 2011-11-29 ENCOUNTER — Telehealth: Payer: Self-pay

## 2011-11-29 NOTE — Telephone Encounter (Signed)
Pt was returning someone's call from here. Determined that Referrals may have been trying to reach her about referral Dr Cleta Alberts wanted for pt to see an ortho. Pt stated that she does not want a referral anywhere unless she can talk w/Dr Cleta Alberts about it some more. She has an appt w/ him in about 3 1/2 wks, but if Dr Cleta Alberts would like for her to proceed w/referral before this, pt requests that Dr Cleta Alberts call her to discuss this. If it can wait, she will just wait and discuss at next appt. Dr Cleta Alberts, I'm routing this to you to decide, and please let Referrals know how to proceed.

## 2011-11-29 NOTE — Telephone Encounter (Signed)
This is Dr Doolittle's pt

## 2011-12-07 ENCOUNTER — Telehealth: Payer: Self-pay

## 2011-12-07 NOTE — Telephone Encounter (Signed)
Pt is wanting to know if dr Merla Riches can replace her xanax rx with klonapan 2 a day to help her sleep.  She states the xanax that was filled on June 8th accidentally lost in her trash can   [employer] number [phone]   [facility]

## 2011-12-09 NOTE — Telephone Encounter (Signed)
We cannot replace her controlled substances simply because it has been lost/

## 2011-12-09 NOTE — Telephone Encounter (Signed)
Left message for patient to call back  

## 2011-12-10 NOTE — Telephone Encounter (Signed)
Spoke with pt advised Rx cannot be refilled. She found her Xanax

## 2011-12-11 ENCOUNTER — Ambulatory Visit: Payer: Medicare Other | Admitting: Internal Medicine

## 2011-12-25 ENCOUNTER — Encounter: Payer: Self-pay | Admitting: Internal Medicine

## 2011-12-25 ENCOUNTER — Ambulatory Visit (INDEPENDENT_AMBULATORY_CARE_PROVIDER_SITE_OTHER): Payer: Medicare Other | Admitting: Internal Medicine

## 2011-12-25 VITALS — BP 95/55 | HR 74 | Temp 97.7°F | Resp 16 | Ht 61.0 in

## 2011-12-25 DIAGNOSIS — M7700 Medial epicondylitis, unspecified elbow: Secondary | ICD-10-CM

## 2011-12-25 DIAGNOSIS — G8929 Other chronic pain: Secondary | ICD-10-CM

## 2011-12-25 DIAGNOSIS — M7701 Medial epicondylitis, right elbow: Secondary | ICD-10-CM

## 2011-12-25 DIAGNOSIS — F339 Major depressive disorder, recurrent, unspecified: Secondary | ICD-10-CM

## 2011-12-25 DIAGNOSIS — F39 Unspecified mood [affective] disorder: Secondary | ICD-10-CM

## 2011-12-25 MED ORDER — OXYCODONE HCL 30 MG PO TABS: ORAL_TABLET | ORAL | Status: DC

## 2011-12-25 MED ORDER — OXYCODONE HCL 15 MG PO TABS: 15.0000 mg | ORAL_TABLET | ORAL | Status: AC | PRN
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
## 2012-01-10 ENCOUNTER — Telehealth: Payer: Self-pay

## 2012-01-10 NOTE — Telephone Encounter (Signed)
The patient called for general information regarding whether an eye would be more likely to be removed for melanoma, prostate cancer or lymphoma?  She is just asking for general information.  Please call patient at [phone].

## 2012-01-11 NOTE — Telephone Encounter (Signed)
Please advise 

## 2012-01-12 NOTE — Telephone Encounter (Signed)
[facility] notifying patient info below-- CB if further ?'s.

## 2012-01-12 NOTE — Telephone Encounter (Signed)
Discussion amongst providers seems to suggest that Melanoma is more likely but we are not certain of this.

## 2012-01-18 ENCOUNTER — Ambulatory Visit (INDEPENDENT_AMBULATORY_CARE_PROVIDER_SITE_OTHER): Payer: Medicare Other | Admitting: Internal Medicine

## 2012-01-18 VITALS — BP 102/61 | HR 65 | Temp 97.8°F | Resp 16

## 2012-01-18 DIAGNOSIS — F339 Major depressive disorder, recurrent, unspecified: Secondary | ICD-10-CM

## 2012-01-18 DIAGNOSIS — G8929 Other chronic pain: Secondary | ICD-10-CM

## 2012-01-18 MED ORDER — OXYCODONE HCL 10 MG PO TABS: 10.0000 mg | ORAL_TABLET | Freq: Every day | ORAL | Status: DC

## 2012-01-18 NOTE — Progress Notes (Signed)
  Subjective:    Patient ID: Morgan Kelly, female    DOB: [birth year], 61 y.o.   MRN: [id number]  HPI2 episodes of diarrhea recently-2 days each Hard time with estate sale Jeanella Craze of house ?next--hard letting go/Sisters not communicating with her , Very upset, very depressed, not suicidal Her is almost out of pain medication and a next scheduled prescription is for Wednesday Will need a few medications to get her through/having to do lots of physical work to get the house ready for the estate sale     Review of Systems     Objective:   Physical ExamIn tears/obviously distressed        Assessment & Plan:  Problem #1 chronic pain syndrome #2 marked depression   Meds ordered this encounter  Medications  . Oxycodone HCl 10 MG TABS    Sig: Take 1 tablet (10 mg total) by mouth 5 (five) times daily.    Dispense:  20 tablet    Refill:  0   No change in antidepressant Followup 5 days as scheduled

## 2012-01-22 ENCOUNTER — Encounter: Payer: Self-pay | Admitting: Internal Medicine

## 2012-01-22 ENCOUNTER — Ambulatory Visit (INDEPENDENT_AMBULATORY_CARE_PROVIDER_SITE_OTHER): Payer: Medicare Other | Admitting: Internal Medicine

## 2012-01-22 VITALS — BP 120/72 | HR 66 | Temp 98.4°F | Resp 16 | Ht 61.5 in | Wt 137.6 lb

## 2012-01-22 DIAGNOSIS — F329 Major depressive disorder, single episode, unspecified: Secondary | ICD-10-CM

## 2012-01-22 DIAGNOSIS — G8929 Other chronic pain: Secondary | ICD-10-CM

## 2012-01-22 MED ORDER — OXYCODONE HCL 15 MG PO TABS: 15.0000 mg | ORAL_TABLET | Freq: Four times a day (QID) | ORAL | Status: DC | PRN

## 2012-01-22 MED ORDER — OXYCODONE HCL 30 MG PO TB12: 30.0000 mg | ORAL_TABLET | Freq: Two times a day (BID) | ORAL | Status: DC
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
## 2012-01-24 ENCOUNTER — Telehealth: Payer: Self-pay | Admitting: Radiology

## 2012-01-24 DIAGNOSIS — G8929 Other chronic pain: Secondary | ICD-10-CM

## 2012-01-24 NOTE — Telephone Encounter (Signed)
Pt has already filled this, stated okay, but she wants you to write remainder of the Rx and call her when it is ready, she has old Rx for Lomotil that is expired and states it is cheaper than buying the Immodium also wants Rx for this, she said just call her and she will pick up the rest of the Oxycodone when it is ready, okay to leave message.

## 2012-01-24 NOTE — Telephone Encounter (Signed)
mssg received from Dr Merla Riches will call Malachi Bonds.

## 2012-01-24 NOTE — Telephone Encounter (Signed)
Left message for her to call me back about this.  

## 2012-01-24 NOTE — Telephone Encounter (Signed)
Message copied by Caffie Damme on Fri Jan 24, 2012  4:24 PM ------      Message from: Tonye Pearson      Created: Fri Jan 24, 2012  3:32 PM       Please call Runa/I think I made an mistake in the number of 15 mg oxycodone instant release tablets that I gave her last Wednesday/the chart says #30 it should be for 4 doses a day or #120 and they'll be glad to correct that with a new prescription this weekend if that's the case

## 2012-01-25 MED ORDER — OXYCODONE HCL 15 MG PO TABS: 15.0000 mg | ORAL_TABLET | Freq: Four times a day (QID) | ORAL | Status: AC | PRN

## 2012-01-25 NOTE — Telephone Encounter (Signed)
Prescription corrected for the remainder of the month

## 2012-01-25 NOTE — Telephone Encounter (Signed)
LMOM RX ready to pick up. 

## 2012-02-04 ENCOUNTER — Telehealth: Payer: Self-pay

## 2012-02-04 DIAGNOSIS — M546 Pain in thoracic spine: Secondary | ICD-10-CM

## 2012-02-04 DIAGNOSIS — R937 Abnormal findings on diagnostic imaging of other parts of musculoskeletal system: Secondary | ICD-10-CM

## 2012-02-04 NOTE — Telephone Encounter (Signed)
The patient called to request referrals to see pain specialist and orthopaedic specialist.  The patient stated she has had MRI of her thoracic spine and it showed multiple issues.  The patient stated she is in excruciating pain and is at her wit's end.  She stated she has had no pain relief/control, and will be coming in on Friday to see Dr. Merla Riches to discuss multiple issues, but that she needs referrals set up so that she may be been as soon as possible due to extreme pain.  Patient was tearful during conversation.  Please call the patient at [phone].

## 2012-02-05 ENCOUNTER — Ambulatory Visit: Payer: Medicare Other | Admitting: Internal Medicine

## 2012-02-05 ENCOUNTER — Other Ambulatory Visit: Payer: Self-pay | Admitting: Internal Medicine

## 2012-02-05 NOTE — Telephone Encounter (Signed)
Pt is confused, the pain management clinic called her and scheduled an appointment for a biopsy.  She wants to see dr. Merla Riches first to find out who he would refer her to. She wants [place] to call the pain management clinic and tell them she will not undergo a biopsy without being put asleep.    She is planning on coming in at nine oclock on Friday to see Dr. Merla Riches. [phone]

## 2012-02-07 NOTE — Telephone Encounter (Signed)
FYI appt had been made for today at 10am but there is no record of anyone calling the patient, and no note back to me about providing info as I requested

## 2012-02-07 NOTE — Telephone Encounter (Signed)
Appointment with referred pain management is next Wednesday She also would now like referral to an orthopedist/she had decided not to have an intervention but has now reconsidered Will refer to Dr Shelle Iron

## 2012-02-11 ENCOUNTER — Telehealth: Payer: Self-pay

## 2012-02-11 NOTE — Telephone Encounter (Signed)
Please take a message as I won't be able to call til late

## 2012-02-11 NOTE — Telephone Encounter (Signed)
LMOM for patient to CB. 

## 2012-02-11 NOTE — Telephone Encounter (Signed)
FOR DR DOOLITTLE ONLY PT STATES SHE IS GOING TO SEE DR SPIVEY TOMORROW, BUT WANTED TO SPEAK WITH YOU FIRST PLEASE CALL [phone]

## 2012-02-12 NOTE — Telephone Encounter (Signed)
Send letter to Dr Jordan Likes this am See communications

## 2012-02-12 NOTE — Telephone Encounter (Signed)
Letter has been sent

## 2012-02-13 ENCOUNTER — Telehealth: Payer: Self-pay | Admitting: Radiology
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
## 2012-02-13 NOTE — Telephone Encounter (Signed)
Patient has called back again about her situation. She asked for your hours, I have provided them for her. I have advised her clearly that the next step for her is the pain management. I have told her this is what you want and this is what she needs to do. I think she plans to come in on Friday or Saturday. I have advised her again to call pain management, she does not want to do this. She also indicated she has misplaced some of her medications.

## 2012-02-14 NOTE — Telephone Encounter (Signed)
Pt called and would like to know if Dr Merla Riches has a preferred pain management clinic he would like to have her go to and if so which one.

## 2012-02-16 ENCOUNTER — Telehealth: Payer: Self-pay
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
## 2012-02-17 NOTE — Telephone Encounter (Signed)
Dr.Doolittle, Pt states that if you are not going to help with any of her issues or criticize her in any way she absolutely cant take it at this time, pt would like to cancel her future appt. with you unless your going to help. [phone]

## 2012-02-19 ENCOUNTER — Ambulatory Visit: Payer: Medicare Other | Admitting: Internal Medicine

## 2012-03-02 ENCOUNTER — Telehealth: Payer: Self-pay | Admitting: Radiology

## 2012-03-02 NOTE — Telephone Encounter (Signed)
Patient did go to pain management today, the clinic ([facility] called to verify when she was last given Rx, I have advised. Kathlene November RN also asked if you would be willing to prescribe her medications for one additional month until his clinic can take over, I advised yes, you have indicated you can do this until they can take over. They will perform urine drug screen today and plan to take over her medications in one month when she returns to their clinic. The clinic is aware you will be back in office on Friday, they will advise patient.

## 2012-03-03 NOTE — Telephone Encounter (Signed)
Patient has called about her medications, I have advised her Dr Merla Riches will be in office on Friday and will address her request for medication renewal. She asks if anyone else can fill her prescriptions in his absence she is advised she will need to wait until Dr Merla Riches is back in the office.

## 2012-03-06 ENCOUNTER — Ambulatory Visit (INDEPENDENT_AMBULATORY_CARE_PROVIDER_SITE_OTHER): Payer: Medicare Other | Admitting: Internal Medicine

## 2012-03-06 VITALS — BP 128/67 | HR 68 | Temp 97.7°F | Resp 16 | Ht 62.0 in | Wt 136.0 lb

## 2012-03-06 DIAGNOSIS — M797 Fibromyalgia: Secondary | ICD-10-CM

## 2012-03-06 DIAGNOSIS — F39 Unspecified mood [affective] disorder: Secondary | ICD-10-CM

## 2012-03-06 DIAGNOSIS — IMO0001 Reserved for inherently not codable concepts without codable children: Secondary | ICD-10-CM

## 2012-03-06 DIAGNOSIS — K589 Irritable bowel syndrome without diarrhea: Secondary | ICD-10-CM

## 2012-03-06 DIAGNOSIS — F32A Depression, unspecified: Secondary | ICD-10-CM

## 2012-03-06 DIAGNOSIS — E039 Hypothyroidism, unspecified: Secondary | ICD-10-CM

## 2012-03-06 DIAGNOSIS — C801 Malignant (primary) neoplasm, unspecified: Secondary | ICD-10-CM

## 2012-03-06 DIAGNOSIS — G8929 Other chronic pain: Secondary | ICD-10-CM

## 2012-03-06 DIAGNOSIS — F329 Major depressive disorder, single episode, unspecified: Secondary | ICD-10-CM

## 2012-03-06 DIAGNOSIS — F339 Major depressive disorder, recurrent, unspecified: Secondary | ICD-10-CM

## 2012-03-06 DIAGNOSIS — F411 Generalized anxiety disorder: Secondary | ICD-10-CM

## 2012-03-06 MED ORDER — CLONAZEPAM 1 MG PO TBDP: 1.0000 mg | ORAL_TABLET | Freq: Two times a day (BID) | ORAL | Status: DC | PRN

## 2012-03-06 MED ORDER — LEVOTHYROXINE SODIUM 200 MCG PO TABS: 200.0000 ug | ORAL_TABLET | Freq: Every day | ORAL | Status: DC

## 2012-03-06 MED ORDER — CITALOPRAM HYDROBROMIDE 40 MG PO TABS: 40.0000 mg | ORAL_TABLET | Freq: Every day | ORAL | Status: DC

## 2012-03-06 MED ORDER — OXYCODONE HCL 15 MG PO TABS: 15.0000 mg | ORAL_TABLET | ORAL | Status: AC | PRN

## 2012-03-06 MED ORDER — OXYCODONE HCL 15 MG PO TABS: 15.0000 mg | ORAL_TABLET | ORAL | Status: DC | PRN

## 2012-03-06 MED ORDER — DIPHENOXYLATE-ATROPINE 2.5-0.025 MG PO TABS: ORAL_TABLET | ORAL | Status: DC

## 2012-03-06 MED ORDER — OXYCODONE HCL 30 MG PO TABS
30.0000 mg | ORAL_TABLET | ORAL | Status: AC | PRN
Start: 2012-03-06 — End: 2012-03-16

## 2012-03-06 MED ORDER — OXYCODONE HCL 30 MG PO TABS: 30.0000 mg | ORAL_TABLET | ORAL | Status: DC | PRN

## 2012-03-06 NOTE — Progress Notes (Signed)
Subjective:    Patient ID: Morgan Kelly, female    DOB: March 22, 1951, 61 y.o.   MRN: [id number]  HPI Patient Active Problem List  Diagnosis  . Chronic pain--pain mgmt says fibro-are assessing further///needs refills   . Asthma in remission  . Edema  . GAD (generalized anxiety disorder)--xanax ineffective now and wants to change back to xanax  . Hypothyroid-Loss medications to water damage and need a refill  . Acne  . Other and unspecified hyperlipidemia  . Fibromyalgia  . Eczema   . Mood disorder-Loss medications to watered and she needs refills  . PTSD (post-traumatic stress disorder)  . Depression, major, recurrent   . Abnormal LFTs-Secondary to [place]  . Nicotine abuse   . 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 Chronic pain  oxyCODONE (ROXICODONE) 15 MG immediate release tablet, oxycodone (ROXICODONE) 30 MG immediate release tablet, DISCONTINUED: oxycodone (ROXICODONE) 30 MG immediate release tablet, DISCONTINUED: oxyCODONE (ROXICODONE) 15 MG immediate release tablet  2. Hypothyroid  levothyroxine (SYNTHROID, LEVOTHROID) 200 MCG tablet  3. Depression  citalopram (CELEXA) 40 MG tablet  4. Depression, major, recurrent    5. IBS (irritable bowel syndrome)  diphenoxylate-atropine (LOMOTIL) 2.5-0.025 MG per tablet  6. Fibromyalgia    7. Mood disorder    8. 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 GAD (generalized anxiety disorder)  clonazePAM (KLONOPIN) 1 MG disintegrating tablet   Awaiting orthopedic evaluation Awaiting second visit with pain management Meds ordered this encounter  Medications  .  DISCONTD: oxycodone (ROXICODONE) 30 MG immediate release tablet    Sig: Take 1 tablet (30 mg total) by mouth every 4 (four) hours as needed for pain.    Dispense:  30 tablet    Refill:  0  . DISCONTD: oxyCODONE (ROXICODONE) 15 MG immediate release tablet    Sig: Take 1 tablet (15 mg total) by mouth every 4 (four) hours as needed for pain.    Dispense:  30 tablet    Refill:  0  . clonazePAM (KLONOPIN) 1 MG disintegrating tablet    Sig: Take 1 tablet (1 mg total) by mouth 2 (two) times daily as needed for anxiety.    Dispense:  30 tablet    Refill:  0  . levothyroxine (SYNTHROID, LEVOTHROID) 200 MCG tablet    Sig: Take 1 tablet (200 mcg total) by mouth daily.    Dispense:  90 tablet    Refill:  1  . citalopram (CELEXA) 40 MG tablet    Sig: Take 1 tablet (40 mg total) by mouth daily.    Dispense:  90 tablet    Refill:  1  . diphenoxylate-atropine (LOMOTIL) 2.5-0.025 MG per tablet    Sig: 1 tab daily as needed    Dispense:  30 tablet    Refill:  5  . oxyCODONE (ROXICODONE) 15 MG immediate release tablet    Sig: Take 1 tablet (15 mg total) by mouth every 4 (four) hours as needed for pain.    Dispense:  120 tablet    Refill:  0  . oxycodone (ROXICODONE) 30 MG immediate release  tablet    Sig: Take 1 tablet (30 mg total) by mouth every 4 (four) hours as needed for pain.    Dispense:  60 tablet    Refill:  0

## 2012-03-12 ENCOUNTER — Telehealth: Payer: Self-pay | Admitting: Radiology

## 2012-03-12 NOTE — Telephone Encounter (Signed)
I have spoken to [facility] to advise patient has not seen another orthopedic surgeon in the past. The office had requested additional records. When I spoke to scheduling at [facility], they stated they had not gotten any records from patients referral, however they called for additional records, I spoke to [place]. Thalia Bloodgood had me fax again what I had on patient and she advised she will make sure appt is made, I faxed this over but have not heard anything back, have you heard? If not can you check on this for her.

## 2012-03-15 ENCOUNTER — Telehealth: Payer: Self-pay

## 2012-03-15 ENCOUNTER — Ambulatory Visit (INDEPENDENT_AMBULATORY_CARE_PROVIDER_SITE_OTHER): Payer: Medicare Other | Admitting: Internal Medicine

## 2012-03-15 VITALS — BP 129/74 | HR 76 | Temp 99.2°F | Resp 18 | Wt 132.0 lb

## 2012-03-15 DIAGNOSIS — G8929 Other chronic pain: Secondary | ICD-10-CM

## 2012-03-15 DIAGNOSIS — M797 Fibromyalgia: Secondary | ICD-10-CM

## 2012-03-15 DIAGNOSIS — K589 Irritable bowel syndrome without diarrhea: Secondary | ICD-10-CM

## 2012-03-15 DIAGNOSIS — F339 Major depressive disorder, recurrent, unspecified: Secondary | ICD-10-CM

## 2012-03-15 DIAGNOSIS — F4310 Post-traumatic stress disorder, unspecified: Secondary | ICD-10-CM

## 2012-03-15 DIAGNOSIS — IMO0001 Reserved for inherently not codable concepts without codable children: Secondary | ICD-10-CM

## 2012-03-15 DIAGNOSIS — F39 Unspecified mood [affective] disorder: Secondary | ICD-10-CM

## 2012-03-15 DIAGNOSIS — C801 Malignant (primary) neoplasm, unspecified: Secondary | ICD-10-CM

## 2012-03-15 DIAGNOSIS — F411 Generalized anxiety disorder: Secondary | ICD-10-CM

## 2012-03-15 MED ORDER — CLONAZEPAM 2 MG PO TABS: 2.0000 mg | ORAL_TABLET | Freq: Two times a day (BID) | ORAL | Status: DC | PRN

## 2012-03-15 NOTE — Progress Notes (Signed)
  Subjective:    Patient ID: Morgan Kelly, female    DOB: 06-07-1951, 61 y.o.   MRN: [id number]  HPIf/u anx last rx written incorrectly See hx benzos. Should be on 2mg  tid as switched back to klonopin  Awaiting eval Dr Shelle Iron of thoracic area--increased pain this week after dog walking/Left side of t2,3,4 In bed 3 days/taking extra pain meds which she does not have permission to do/see las t scrips written 9/13 to last to 10/13//Thoracic pain is better at this point but still  Painful with any movement No new sensory symptoms/no weakness in the upper extremities  Diarrhea last month during oxy withdrawal ???she now reports that this is why she was ill and missed appointments  Pain clinic eval in progress tho she's not happy so far with their approach  Current issues: 1. Chronic pain   2. Depression, major, recurrent   3. GAD (generalized anxiety disorder)   4. IBS (irritable bowel syndrome)   5. Mood disorder   6. 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 Fibromyalgia   8. PTSD (post-traumatic stress disorder)   depression is improved over last visit   Review of Systems No changes From recent visits No fever chills or night sweats/no weight loss/no cough/no shortness of breath    Objective:   Physical Exam Filed Vitals:   03/15/12 0744  BP: 129/74  Pulse: 76  Temp: 99.2 F (37.3 C)  Resp: 18   She is in no acute distress today Her mood is stable and she is well involved in our discussion Her sitting posture shows no signs of limitation by pain/her gait as she is leaving exam  also is intact There are no motor or sensory losses in her upper extremities Range of motion of the neck and upper back is limited by discomfort      Assessment & Plan:  Problem #1 chronic pain with superimposed acute pain of uncertain etiology awaiting orthopedic evaluation/I will call the orthopedic office again tomorrow with the referral request Problem #2 unauthorized use of  chronic pain meds for acute pain  She understands that these medications will not be replaced and her next due date for opiates  is 04/05/2012.She is to followup this week with preferred pain management. Problem #3 multiple psychiatric diagnoses  Her psychiatric problems prevent her from living a normal life//she's never responded to   Therapy-Psychological evaluation by Marga Hoots many years ago concluded that successful  intervention that could lead to longlasting improvement would be unlikely  She has an appointment for followup with me  In 2 and half weeks

## 2012-03-15 NOTE — Telephone Encounter (Signed)
Pt stated was seen this AM by Dr Merla Riches. Pt's back hurting more this afternoon. She has questions, please give pt call back @ [phone].

## 2012-03-16 ENCOUNTER — Telehealth: Payer: Self-pay | Admitting: Radiology

## 2012-03-16 NOTE — Telephone Encounter (Signed)
Patient called wanting to know if Dr. Merla Riches called and got the misunderstanding with Dr. Ermelinda Das office taken care of. States Dr. Shelle Iron is not taking over her care, he is just looking at her spine. She needs to make sure everything has been cleared up so she can get an appt with Dr. Shelle Iron. Please call her to let her know that the call to Dr. Shelle Iron has been made so she can get her appt.  Patient # [phone].

## 2012-03-16 NOTE — Telephone Encounter (Signed)
I have sent information and so has Lupita Leash. I spoke to Lupita Leash and patient apparently told scheduler she needs pain management. I advised Lupita Leash patient does not need pain management she needs eval for her abnormal MRI we are not asking for anyone to take over pain management, she has gone to a pain clinic. Lupita Leash was going to call back to let scheduling know. I can probably call over to Dr Otelia Sergeant office and get her in there, he said he will be back to work  9/30.,  So I can probably get her in sometime in October, please let me know. Nafeesa Dils

## 2012-03-16 NOTE — Telephone Encounter (Signed)
Message copied by Caffie Damme on Mon Mar 16, 2012  9:49 AM ------      Message from: Tonye Pearson      Created: Sun Mar 15, 2012  8:28 AM       Check status of referral to Dr Seward Meth no answer tomorrow tell them to forget it and we'll use someone else

## 2012-03-17 ENCOUNTER — Telehealth: Payer: Self-pay | Admitting: Radiology

## 2012-03-17 NOTE — Telephone Encounter (Signed)
Patient states she has not called Dr Shelle Iron office back, they did try to call her. She is going to call  Dr Shelle Iron and make the appt. She will let me know whether or not they will see her.

## 2012-03-17 NOTE — Telephone Encounter (Signed)
Noted, previous mssg sent to Dr Renold Don.

## 2012-03-17 NOTE — Telephone Encounter (Signed)
PATIENT HAS QUESTIONS REGARDING A REFERRAL TO DR. Shelle Iron. SHE DOES NOT WANT TO GO TO HIM. SHE WOULD LIKE TO TALK TO SOMEONE ABOUT IT SINCE DR. DOOLITTLE IS NOT GOING TO BE HERE FOR A FEW DAYS.  BEST PHONE [phone]  MBC

## 2012-03-17 NOTE — Telephone Encounter (Signed)
I called [place] [facility] back, they would not speak to patient about an appt. Lupita Leash called and was told Dr Shelle Iron has to review records. I called and spoke to [place] and explained it is unacceptable that we have sent records multiple times on a patient since one month ago and they have yet to be reviewed by the physician or even presented to the physician. I explained I need to know yes or no if Dr Shelle Iron will see patient, because she has specifically requested Dr Shelle Iron. I gave Junious Dresser my # and hopefully she will call me back with an answer after they finally present the record to Dr Shelle Iron. Patient has indicated Dr Shelle Iron did surgery on her brother in law and she would really like to see him.   FYI

## 2012-03-18 ENCOUNTER — Telehealth: Payer: Self-pay

## 2012-03-18 NOTE — Telephone Encounter (Signed)
Pt. Called with questions about her referral. Pt. Needs someone to call back And discuss her issues.  [phone]

## 2012-03-18 NOTE — Telephone Encounter (Signed)
I advised her yesterday I will speak to her after I find out if they will see her or not. She has specifically requested him and Lupita Leash and I have been working on this appt for over a month. Should she choose not to see him, it is certainly her choice, but I would like to find out if he is willing to accept her or not first.

## 2012-03-19 NOTE — Telephone Encounter (Signed)
Have your heard anything from [facility] Dr Shelle Iron, will they see patient?

## 2012-03-19 NOTE — Telephone Encounter (Signed)
Dr Shelle Iron has declined to see patient, I spoke to [facility] at his office this am. I am unsure why it took his office over a month to call us back about this. I called pt back to find out where she wants to go from here. I have advised Dr Merla Riches Dr Otelia Sergeant will be back in the office next week and we can call him and see if he will see patient. I want Dr Merla Riches to speak to Dr Otelia Sergeant directly about this. But before I involve another surgeon in her case, I want to make sure she wants to see him, I called her but did not get answer or VM, will try again lager.

## 2012-03-23 ENCOUNTER — Telehealth: Payer: Self-pay

## 2012-03-23 NOTE — Telephone Encounter (Signed)
Pt called back and stated that she really would like a call back from dr Merla Riches only -she states that he used to call her back and now since [facility] he doesn't and wants to explain why she is blocking some of her records from [facility]  Best number [phone]

## 2012-03-23 NOTE — Telephone Encounter (Signed)
DOOLITTLE - PT HAS HAD SOME VERY BAD DAYS.   PLEASE DO NOT MAKE A REFERRAL UNTIL SHE SEES YOU AGAIN BECAUSE SHE DOESN'T KNOW IF SHE WOULD BE ABLE TO GET TO THE REFERRAL OR NOT DUE TO THE PAIN. [phone]

## 2012-03-23 NOTE — Telephone Encounter (Signed)
Patient does not want me to work on the referral until she come in to see you. I do not want to make appt if she does not keep it. Please see me about this after you see patient.

## 2012-03-28 ENCOUNTER — Telehealth: Payer: Self-pay | Admitting: Family Medicine

## 2012-03-28 ENCOUNTER — Telehealth: Payer: Self-pay

## 2012-03-28 ENCOUNTER — Ambulatory Visit: Payer: Medicare Other | Admitting: Internal Medicine

## 2012-03-28 NOTE — Telephone Encounter (Signed)
Patient would like Dr. Merla Riches to call Samuel Germany at the Pain Clinic to discuss her recent appointment there before she sees Dr Merla Riches on Wednesday.

## 2012-03-28 NOTE — Telephone Encounter (Signed)
Pt would like for you to call her regarding her pain clinic visit. And she has not received any referrals from the other doctors regarding her spine. Please call patient back at [phone]

## 2012-03-28 NOTE — Telephone Encounter (Signed)
pa

## 2012-03-28 NOTE — Telephone Encounter (Signed)
Pt is here to speak with Dr Merla Riches

## 2012-03-29 NOTE — Progress Notes (Signed)
Patient left without being seen/she was angry with staff that she was not seen before everyone else She says that Dr. Perrin Maltese told her that she would be seen first many years ago She called back several times during this day with many demands for the clinic staff none of which were important and all of which were based on some interpretation of reality that was not correct She put a new message on her answering machine about her experience of not being seen and how upset she was and how much pain she's in and how she might be out of medicines She has been given an appointment for 9 AM at 104 on 04/01/12 She wants to discuss why she's blocking all her records from anyone to access at [facility]/she is angry at her pain management referral/we had arranged orthopedic consultation with Dr. Sherlynn Carbon and she has refused to go until she can see me again in clinic. All of this behavior clearly fits her diagnosis of borderline personality disorder which was established many years ago by Dr. Marga Hoots

## 2012-03-29 NOTE — Patient Instructions (Addendum)
The staff was instructed to try to accommodate her questions but to not feel intimidated by her anger and the expressions of her displeasure They were reassured that they had done nothing wrong in dealing with her and this this behavior is common place for her

## 2012-03-31 ENCOUNTER — Telehealth: Payer: Self-pay | Admitting: Radiology

## 2012-03-31 NOTE — Telephone Encounter (Signed)
Left message for patient, she has called and spoken to answering service at 2am yesterday am, to ask if records are ready from pain clinic. Dr Merla Riches does have these records. He is asking if patient wants to see him 04/01/12 or if she is going to cancel. If she calls back I will find out and let her know.

## 2012-04-01 ENCOUNTER — Ambulatory Visit (INDEPENDENT_AMBULATORY_CARE_PROVIDER_SITE_OTHER): Payer: Medicare Other | Admitting: Internal Medicine

## 2012-04-01 DIAGNOSIS — M797 Fibromyalgia: Secondary | ICD-10-CM

## 2012-04-01 DIAGNOSIS — M47812 Spondylosis without myelopathy or radiculopathy, cervical region: Secondary | ICD-10-CM | POA: Insufficient documentation

## 2012-04-01 DIAGNOSIS — IMO0001 Reserved for inherently not codable concepts without codable children: Secondary | ICD-10-CM

## 2012-04-01 DIAGNOSIS — I7300 Raynaud's syndrome without gangrene: Secondary | ICD-10-CM | POA: Insufficient documentation

## 2012-04-01 DIAGNOSIS — G8929 Other chronic pain: Secondary | ICD-10-CM

## 2012-04-01 DIAGNOSIS — F339 Major depressive disorder, recurrent, unspecified: Secondary | ICD-10-CM

## 2012-04-01 DIAGNOSIS — F39 Unspecified mood [affective] disorder: Secondary | ICD-10-CM

## 2012-04-01 DIAGNOSIS — M5030 Other cervical disc degeneration, unspecified cervical region: Secondary | ICD-10-CM

## 2012-04-01 DIAGNOSIS — F411 Generalized anxiety disorder: Secondary | ICD-10-CM

## 2012-04-01 DIAGNOSIS — C801 Malignant (primary) neoplasm, unspecified: Secondary | ICD-10-CM

## 2012-04-01 MED ORDER — OXYCODONE HCL 30 MG PO TABS: ORAL_TABLET | ORAL | Status: DC

## 2012-04-01 MED ORDER — OXYCODONE HCL 10 MG PO TABS: 10.0000 mg | ORAL_TABLET | Freq: Four times a day (QID) | ORAL | Status: DC | PRN

## 2012-04-01 MED ORDER — OXYCODONE HCL 15 MG PO TABS: 15.0000 mg | ORAL_TABLET | Freq: Four times a day (QID) | ORAL | Status: DC | PRN

## 2012-04-01 NOTE — Progress Notes (Signed)
Subjective:    Patient ID: Morgan Kelly, female    DOB: Nov 11, 1950, 61 y.o.   MRN: [id number]  HPIf/u for med refill and recheck Patient Active Problem List  Diagnosis  . Chronic pain  . Asthma in remission  . Edema  . GAD (generalized anxiety disorder)  . Hypothyroid  . Acne  . Other and unspecified hyperlipidemia  . Fibromyalgia  . Eczema  . Mood disorder  . PTSD (post-traumatic stress disorder)  . Depression, major, recurrent  . Abnormal LFTs  . Nicotine abuse  . 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 IBS (irritable bowel syndrome)  . 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 She has some hand pain particularly her right thumb. She has multiple paresthesias and trouble with grip of the right hand. Her depression is better this week. Her sleep is still poor due to pain and her mood disorder. She continues to live near the edge of poverty. She cannot afford therapy, either physical or psychological. She continues To rely on short acting oxycodone And Lidoderm patches for pain control While undergoing a pain management evaluation at preferred pain management. She continues to smoke and use alcohol but denies other drugs. She is out of oxycodone today with a next scheduled prescriptions for October 13 She has been at a reduced dose for the past 3 days because of her earlier overuse when she was depressed at the end of September. She has started having diarrhea and stomach cramps this morning. Dr. Shelle Iron at [facility] refused our referral for further evaluation and we are discussing possible evaluation with Dr. Vira Browns. She is still uncertain about proceeding with aggressive treatment should a neoplasm be discovered. After the MRI revealed an abnormal signal in  the thoracic spine she initially declined any further evaluation until just recently.  Review of Systems No change from the past/multiple nonspecific symptoms    Objective:   Physical Exam refused vital signs Oriented to time person and place Today her demeanor is controlled and her conversation lucid She is in mild discomfort Her affect is not anxious or depressed though she is frustrated with her numerous problems She has tenderness at the base of the right thumb without signs of joint hypertrophy in her hands in general      Assessment & Plan:   1. Disabled from multiple psychological and medical problems  2. Fibromyalgia   3. GAD (generalized anxiety disorder)   4. Mood disorder   5. 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 Depression, major, recurrent   7. Chronic pain -Multiple myalgias and arthralgias  8. Cervical spondylosis With facet arthropathy   9. Degenerative disc disease, cervical    She will followup with preferred pain management so that we can begin isolating treat her various causes of pain. I'll be glad to continue to prescribe opiates and still some decision is reached about a better care than short acting oxycodone. She will obviously need multiple interventions and has a low likelihood of an adequate clinical response. Orthopedic evaluation may help [place] modify the problems we can expect from her orthopedic dilemmas, but most importantly will help guide [place] through the diagnosis of the abnormality in her thoracic spine. Followup here 1 month Meds ordered this encounter  Medications  . Oxycodone HCl 10 MG TABS  Sig: Take 1 tablet (10 mg total) by mouth 4 (four) times daily as needed.    Dispense:  16 tablet     to use to prevent withdrawal until time for her next medications    Refill:  0  . oxyCODONE (ROXICODONE) 15 MG immediate release tablet    Sig: Take 1 tablet (15 mg total) by mouth every 6 (six) hours as needed for pain...during the day     Dispense:  120 tablet    For 04/05/12    Refill:  0  . oxycodone (ROXICODONE) 30 MG immediate release tablet    Sig: One in am and one hs as a baseline to get her moving in am and provide better sleep she declines extended release    Dispense:  60 tablet       For 04/05/12    Refill:  0

## 2012-04-02 ENCOUNTER — Telehealth: Payer: Self-pay

## 2012-04-02 ENCOUNTER — Other Ambulatory Visit: Payer: Self-pay | Admitting: Physician Assistant

## 2012-04-02 NOTE — Telephone Encounter (Signed)
PT REQUESTS THAT WE INFORM DR NITKA THAT HER PAIN IS NOT JUST IN THE BACK IT IS IN THE FRONT UNDER BREAST AREA AS WELL AND IS PAINFUL WITH RESPIRATIONS, WANTED THIS INFORMATION RELAYED TO DR NITKA SHE IS NOT SLEEPING WELL BECAUSE SHE IS IN SO MUCH PAIN, SHE CANNOT USE LIDODERM BECAUSE THEY MAKE HER ITCH, PT THINKS THIS INFO WOULD BE HELPFUL FOR DR NITKA    NOTE: PT INQUIRED ABOUT RELEASE OF MR'S TO OTHER PROVIDERS AND/OR OTHER [facility] ACCESS TO HER RECORDS THROUGH EPIC, I ASSURED HER THAT NO ONE UNLESS HAS AUTHORITY/SECURITY TO DO SO WOULD ANYONE ACCESS RECORDS UNLESS RELATED TO HER CARE/TREAMTMENT AT THAT OFFICE FOR THAT CONDITION

## 2012-04-02 NOTE — Telephone Encounter (Signed)
Please advise, does patient want to see Dr Otelia Sergeant? Also, she wants to know if she can have renewal of Ativan.

## 2012-04-02 NOTE — Telephone Encounter (Signed)
PT STATES SHE HAD SEEN DR DOOLITTLE AND EVERY THING SHE HAVE TRIED HAVE STOPPED WORKING. WOULD LIKE TO KNOW ABOUT ADAVAN. PLEASE CALL [phone]

## 2012-04-03 NOTE — Telephone Encounter (Signed)
Thanks, I did get this, and I will call Dr Otelia Sergeant to see if he will see patient, want you to discuss with him when you are here next week. I will call patient this pm about her need for Chest Xray

## 2012-04-03 NOTE — Telephone Encounter (Signed)
Her chest pain that hurts with inspiration is not connected to her her spine pain and should represent something new/different-we need to do an exam and chest xray ?sat at 8am She is on 2mg  klonopin and ativan is not as strong so changing won't make a difference/buspar may be a possibility but we should reevaluate her first At her OV this week I did try to send Amy a message about going ahead w/ referral to Dr Otelia Sergeant as outlined in the Plan

## 2012-04-06 NOTE — Telephone Encounter (Signed)
I called Dr Otelia Sergeant office and left message for him to call Dr Merla Riches back about this patient.

## 2012-04-07 ENCOUNTER — Telehealth: Payer: Self-pay | Admitting: Radiology

## 2012-04-07 MED ORDER — SULFAMETHOXAZOLE-TMP DS 800-160 MG PO TABS: 1.0000 | ORAL_TABLET | ORAL | Status: DC | PRN

## 2012-04-07 NOTE — Telephone Encounter (Signed)
He did not call back , I will call again tomorrow.

## 2012-04-07 NOTE — Telephone Encounter (Signed)
Patient requesting renewal on Septra DS, please advise.

## 2012-04-07 NOTE — Telephone Encounter (Signed)
For acne ok to rfeill 3months

## 2012-04-08 ENCOUNTER — Telehealth: Payer: Self-pay

## 2012-04-08 NOTE — Telephone Encounter (Signed)
Yes, she needs to go to pain management these appts do not have anything to do with each other. She will continue pain management.

## 2012-04-08 NOTE — Telephone Encounter (Signed)
Patient is wanting to know if she should keep her appt with the pain clinic since she has not received her appt for Dr Otelia Sergeant.     BEST#: [id number]

## 2012-04-12 ENCOUNTER — Telehealth: Payer: Self-pay

## 2012-04-12 NOTE — Telephone Encounter (Signed)
PATIENT STATES THE LAST TIME SHE SAW DR. Merla Riches THEY TALKED ABOUT HER GETTING THE SHINGLES SHOT. SHE WOULD LIKE TO GET AN ORDER INTO HER PHARMACY PLEASE. BEST PHONE [phone] (HOME)  PHARMACY CHOICE IS [facility].  MBC

## 2012-04-13 ENCOUNTER — Encounter (HOSPITAL_COMMUNITY): Payer: Self-pay | Admitting: Emergency Medicine

## 2012-04-13 ENCOUNTER — Emergency Department (HOSPITAL_COMMUNITY)
Admission: EM | Admit: 2012-04-13 | Discharge: 2012-04-14 | Discharge status: Against Medical Advice | Payer: Medicare Other | Attending: Emergency Medicine | Admitting: Emergency Medicine

## 2012-04-13 DIAGNOSIS — R42 Dizziness and giddiness: Secondary | ICD-10-CM | POA: Insufficient documentation

## 2012-04-13 DIAGNOSIS — J45909 Unspecified asthma, uncomplicated: Secondary | ICD-10-CM | POA: Insufficient documentation

## 2012-04-13 DIAGNOSIS — S99919A Unspecified injury of unspecified ankle, initial encounter: Secondary | ICD-10-CM | POA: Insufficient documentation

## 2012-04-13 DIAGNOSIS — IMO0001 Reserved for inherently not codable concepts without codable children: Secondary | ICD-10-CM | POA: Insufficient documentation

## 2012-04-13 DIAGNOSIS — G5600 Carpal tunnel syndrome, unspecified upper limb: Secondary | ICD-10-CM | POA: Insufficient documentation

## 2012-04-13 DIAGNOSIS — Y9301 Activity, walking, marching and hiking: Secondary | ICD-10-CM | POA: Insufficient documentation

## 2012-04-13 DIAGNOSIS — M25469 Effusion, unspecified knee: Secondary | ICD-10-CM | POA: Insufficient documentation

## 2012-04-13 DIAGNOSIS — Y929 Unspecified place or not applicable: Secondary | ICD-10-CM | POA: Insufficient documentation

## 2012-04-13 DIAGNOSIS — W010XXA Fall on same level from slipping, tripping and stumbling without subsequent striking against object, initial encounter: Secondary | ICD-10-CM | POA: Insufficient documentation

## 2012-04-13 DIAGNOSIS — G894 Chronic pain syndrome: Secondary | ICD-10-CM | POA: Insufficient documentation

## 2012-04-13 DIAGNOSIS — Z79899 Other long term (current) drug therapy: Secondary | ICD-10-CM | POA: Insufficient documentation

## 2012-04-13 DIAGNOSIS — E039 Hypothyroidism, unspecified: Secondary | ICD-10-CM | POA: Insufficient documentation

## 2012-04-13 DIAGNOSIS — E785 Hyperlipidemia, unspecified: Secondary | ICD-10-CM | POA: Insufficient documentation

## 2012-04-13 DIAGNOSIS — S8990XA Unspecified injury of unspecified lower leg, initial encounter: Secondary | ICD-10-CM | POA: Insufficient documentation

## 2012-04-13 DIAGNOSIS — F54 Psychological and behavioral factors associated with disorders or diseases classified elsewhere: Secondary | ICD-10-CM | POA: Insufficient documentation

## 2012-04-13 DIAGNOSIS — W1789XA Other fall from one level to another, initial encounter: Secondary | ICD-10-CM | POA: Insufficient documentation

## 2012-04-13 DIAGNOSIS — F17200 Nicotine dependence, unspecified, uncomplicated: Secondary | ICD-10-CM | POA: Insufficient documentation

## 2012-04-13 DIAGNOSIS — R609 Edema, unspecified: Secondary | ICD-10-CM | POA: Insufficient documentation

## 2012-04-13 MED ORDER — ZOSTER VACCINE LIVE 19400 UNT/0.65ML ~~LOC~~ SOLR: 0.6500 mL | Freq: Once | SUBCUTANEOUS | Status: DC

## 2012-04-13 NOTE — Telephone Encounter (Signed)
Ok to call in zostavax to [facility]

## 2012-04-13 NOTE — ED Notes (Signed)
Report given via EMS. Pt tripped and fell at 1700 this evening. Pt c/o bilateral knee pain. Left knee swollen on arrival. Pt took an oxy and two sips of beer earlier. Initial 128/78 Pulse 76 RR 18 at 2315. Denies LOC, neck pain, back pain, head trauma.

## 2012-04-13 NOTE — Telephone Encounter (Signed)
Dr Otelia Sergeant called back on Thursday about Shakkia, he has reviewed her notes, has them there. He is out of the office today, will be back on Tuesday, will try to call then. Amy

## 2012-04-13 NOTE — Telephone Encounter (Signed)
Shingles vaccine sent in left message for her to advise.

## 2012-04-13 NOTE — ED Notes (Addendum)
Pt states she was returning the neighbor's trash cans. Pt reports falling, sitting down for awhile, tried to ice it, and could not handle the pain.

## 2012-04-14 ENCOUNTER — Emergency Department (HOSPITAL_COMMUNITY): Payer: Medicare Other

## 2012-04-14 MED ORDER — FENTANYL CITRATE 0.05 MG/ML IJ SOLN
50.0000 ug | Freq: Once | INTRAMUSCULAR | Status: AC
  Administered 2012-04-14: 50 ug via INTRAVENOUS
  Filled 2012-04-14: qty 2

## 2012-04-14 MED ORDER — OXYCODONE-ACETAMINOPHEN 5-325 MG PO TABS: 1.0000 | ORAL_TABLET | Freq: Once | ORAL | Status: DC

## 2012-04-14 MED ORDER — OXYCODONE-ACETAMINOPHEN 5-325 MG PO TABS: 2.0000 | ORAL_TABLET | Freq: Once | ORAL | Status: DC

## 2012-04-14 NOTE — ED Notes (Signed)
Pt in room crying stating, "What's she giving me? Percocet? That's a joke. I took that today. What I need is a shot of Dilaudid in my ass. I'm going to call my sister and get a ride home." Pt given cordless phone to contact sister and offered wheelchair for transfer out of ED.

## 2012-04-14 NOTE — ED Notes (Signed)
NP at bedside.

## 2012-04-14 NOTE — ED Notes (Signed)
Pt transported to XRay 

## 2012-04-14 NOTE — ED Provider Notes (Signed)
History     CSN: [id number]  Arrival date & time 04/13/12  2349   First MD Initiated Contact with Patient 04/14/12 0111      Chief Complaint  Patient presents with  . Fall    (Consider location/radiation/quality/duration/timing/severity/associated sxs/prior treatment) HPI Comments: Patient states she was walking in her neighbors are when she tripped retroflexed in landing on her knees in by EMS due to extreme pain in both knees left greater than right.  She has not taken any new medication.  Prior to arrival  Patient is a 61 y.o. female presenting with fall. The history is provided by the patient.  Fall The accident occurred less than 1 hour ago. The fall occurred while walking. She fell from a height of 1 to 2 ft. She landed on a hard floor. There was no blood loss. Pertinent negatives include no numbness and no headaches.    Past Medical History  Diagnosis Date  . Fibromyalgia   . Chronic pain syndrome   . Hypothyroid   . Psychiatric disorder   . CTS (carpal tunnel syndrome)   . Acne   . Eczema   . Edema   . Hyperlipidemia   . History of asthma     History reviewed. No pertinent past surgical history.  No family history on file.  History  Substance Use Topics  . Smoking status: Current Every Day Smoker  . Smokeless tobacco: Not on file  . Alcohol Use: Yes     occas    OB History    Grav Para Term Preterm Abortions TAB SAB Ect Mult Living                  Review of Systems  Constitutional: Negative for activity change.  Musculoskeletal: Positive for joint swelling.  Skin: Negative for wound.  Neurological: Positive for dizziness. Negative for weakness, numbness and headaches.    Allergies  Alprazolam; Elavil; Eszopiclone; Flexeril; Hydrocodone; Neurontin; Pregabalin; Soma; Ultracet; Ultram; Wellbutrin; and Zolpidem tartrate  Home Medications   Current Outpatient Rx  Name Route Sig Dispense Refill  . ALBUTEROL SULFATE HFA 108 (90 BASE) MCG/ACT IN  AERS Inhalation Inhale 2 puffs into the lungs every 6 (six) hours as needed. 1 Inhaler 5  . BELLADONNA ALK-PHENOBARBITAL 16.2 MG PO TABS Oral Take 1 tablet by mouth every 8 (eight) hours as needed.     . CEPHALEXIN 500 MG PO CAPS Oral Take 500 mg by mouth 4 (four) times daily.    Marland Kitchen CITALOPRAM HYDROBROMIDE 40 MG PO TABS Oral Take 1 tablet (40 mg total) by mouth daily. 90 tablet 1  . CLEOCIN-T 1 % EX LOTN  APPLY SPARINGLY AND MASSAGE IN TWICE A DAY 60 mL 11  . CLONAZEPAM 2 MG PO TABS Oral Take 1 tablet (2 mg total) by mouth 2 (two) times daily as needed for anxiety. 90 tablet 2  . DIPHENOXYLATE-ATROPINE 2.5-0.025 MG PO TABS Oral Take 1 tablet by mouth 4 (four) times daily as needed. diarrhea    . FLUTICASONE-SALMETEROL 250-50 MCG/DOSE IN AEPB Inhalation Inhale 1 puff into the lungs every 12 (twelve) hours. 60 each 5  . LASIX 20 MG PO TABS  TAKE 1 TABLET EVERY 3 DAYS AS NEEDED. 30 tablet 0  . LEVOTHYROXINE SODIUM 200 MCG PO TABS Oral Take 1 tablet (200 mcg total) by mouth daily. 90 tablet 1  . LIDOCAINE 5 % EX PTCH  Use 3 patches a day. Remove & Discard patch within 12 hours or as directed by  MD 90 patch 5  . LORATADINE 10 MG PO TABS Oral Take 10 mg by mouth daily.     . OXYCODONE HCL 15 MG PO TABS Oral Take 1 tablet (15 mg total) by mouth every 6 (six) hours as needed for pain. 120 tablet 0  . OXYCODONE HCL 30 MG PO TABS  One in am and one hs 60 tablet 0  . SINGULAIR 10 MG PO TABS  TAKE ONE TABLET AT BEDTIME. 30 each 5  . SPIRONOLACTONE 100 MG PO TABS Oral Take 100 mg by mouth daily. PER PT USE PRN    . SULFAMETHOXAZOLE-TMP DS 800-160 MG PO TABS Oral Take 1 tablet by mouth as needed. 60 tablet 0    BP 115/46  Pulse 64  Temp 98.2 F (36.8 C) (Oral)  Resp 16  SpO2 97%  Physical Exam  Constitutional: She is oriented to person, place, and time. She appears well-developed and well-nourished.  HENT:  Head: Normocephalic.  Eyes: Pupils are equal, round, and reactive to light.  Neck: Normal  range of motion.  Cardiovascular: Normal rate.   Pulmonary/Chest: Effort normal.  Musculoskeletal: She exhibits edema and tenderness.       Legs: Neurological: She is alert and oriented to person, place, and time.  Skin: Skin is warm and dry. No erythema.    ED Course  Procedures (including critical care time)  Labs Reviewed - No data to display Dg Knee Complete 4 Views Left  04/14/2012  *RADIOLOGY REPORT*  Clinical Data: Fall and landed on both knees.  Patella pain.  LEFT KNEE - COMPLETE 4+ VIEW  Comparison: None.  Findings: Four views of the left knee were obtained.  No evidence for acute fracture or dislocation.  No evidence for a suprapatellar joint effusion.  The patella is intact.  IMPRESSION: No acute bony abnormality.   Original Report Authenticated By: Richarda Overlie, M.D.    Dg Knee Complete 4 Views Right  04/14/2012  *RADIOLOGY REPORT*  Clinical Data: Fall and patellar pain.  RIGHT KNEE - COMPLETE 4+ VIEW  Comparison: None.  Findings: Four views of the right knee were obtained.  No evidence for acute fracture or dislocation.  No evidence for suprapatellar joint effusion.  The patella is intact.  IMPRESSION: No acute bony abnormality.   Original Report Authenticated By: Richarda Overlie, M.D.      1. Knee injury       MDM   Is a left is 4.  X-rays were 50 red in the overt fractures.  Patient has chronic pain medication.  At home, that she can take for any discomfort.  She refused to wait for official reading        Arman Filter, NP 04/14/12 [phone]

## 2012-04-14 NOTE — ED Notes (Signed)
Pt standing and dressed without support and requesting wheelchair standing at room door.

## 2012-04-14 NOTE — ED Provider Notes (Signed)
Medical screening examination/treatment/procedure(s) were performed by non-physician practitioner and as supervising physician I was immediately available for consultation/collaboration.  Cheri Guppy, MD 04/14/12 [phone]

## 2012-04-15 ENCOUNTER — Telehealth: Payer: Self-pay | Admitting: Radiology

## 2012-04-15 ENCOUNTER — Telehealth: Payer: Self-pay

## 2012-04-15 MED ORDER — CLONAZEPAM 2 MG PO TABS: 2.0000 mg | ORAL_TABLET | Freq: Two times a day (BID) | ORAL | Status: DC | PRN

## 2012-04-15 NOTE — Telephone Encounter (Signed)
Pt would like a refill on clanzepam. Pt would like enough to last her until she is able to see him next week. Best# [phone]

## 2012-04-15 NOTE — Telephone Encounter (Signed)
Patient called again about the appt with Dr Otelia Sergeant, he has advised me he will see patient, can we send him a letter about her? It has been hard to get in touch with him by phone. I have written letter would like you to review, please.

## 2012-04-15 NOTE — Telephone Encounter (Signed)
Appt is 04-29-12

## 2012-04-15 NOTE — Telephone Encounter (Signed)
At tl desk 

## 2012-04-15 NOTE — Telephone Encounter (Signed)
Patient advised.

## 2012-04-17 ENCOUNTER — Telehealth: Payer: Self-pay

## 2012-04-17 NOTE — Telephone Encounter (Signed)
He did call, but Dr Merla Riches out of office.  Letter sent to Dr Otelia Sergeant

## 2012-04-17 NOTE — Telephone Encounter (Signed)
Patient says since Dr. Merla Riches changed her back from alprazolam to klonopin her dosage has been incorrect. It used to be 2mg  3 times per day, but she is now getting 1mg  2 times per day. She ran out and is in a lot of pain so she would like Dr. Merla Riches to review her chart and put her back on the original dosage if possible.  Best [phone]

## 2012-04-20 NOTE — Telephone Encounter (Signed)
She was given 2mg  tabs by Herma Ard last week to las til f/u

## 2012-04-21 NOTE — Telephone Encounter (Signed)
[facility] and was told that pt p/up a Rx for Klonopin 2 mg TID #90. LMOM for pt that we had checked w/pharmacy and was told that pt did get this Rx which should cover her well past her appt w/Dr Merla Riches. Advised pt to CB w/any other ?s/concerns.

## 2012-04-29 ENCOUNTER — Encounter: Payer: Self-pay | Admitting: Internal Medicine

## 2012-04-29 ENCOUNTER — Ambulatory Visit (INDEPENDENT_AMBULATORY_CARE_PROVIDER_SITE_OTHER): Payer: Medicare Other | Admitting: Internal Medicine

## 2012-04-29 VITALS — BP 120/72 | HR 82 | Temp 98.6°F | Resp 16 | Ht 62.0 in | Wt 127.6 lb

## 2012-04-29 DIAGNOSIS — C801 Malignant (primary) neoplasm, unspecified: Secondary | ICD-10-CM

## 2012-04-29 DIAGNOSIS — G8929 Other chronic pain: Secondary | ICD-10-CM

## 2012-04-29 DIAGNOSIS — F4310 Post-traumatic stress disorder, unspecified: Secondary | ICD-10-CM

## 2012-04-29 DIAGNOSIS — F339 Major depressive disorder, recurrent, unspecified: Secondary | ICD-10-CM

## 2012-04-29 DIAGNOSIS — Z720 Tobacco use: Secondary | ICD-10-CM

## 2012-04-29 DIAGNOSIS — F411 Generalized anxiety disorder: Secondary | ICD-10-CM

## 2012-04-29 DIAGNOSIS — M797 Fibromyalgia: Secondary | ICD-10-CM

## 2012-04-29 DIAGNOSIS — M47812 Spondylosis without myelopathy or radiculopathy, cervical region: Secondary | ICD-10-CM

## 2012-04-29 DIAGNOSIS — F39 Unspecified mood [affective] disorder: Secondary | ICD-10-CM

## 2012-04-29 DIAGNOSIS — IMO0001 Reserved for inherently not codable concepts without codable children: Secondary | ICD-10-CM

## 2012-04-29 MED ORDER — OXYCODONE HCL 15 MG PO TABS: 15.0000 mg | ORAL_TABLET | Freq: Four times a day (QID) | ORAL | Status: DC | PRN

## 2012-04-29 MED ORDER — OXYCODONE HCL 30 MG PO TABS: ORAL_TABLET | ORAL | Status: DC

## 2012-04-29 MED ORDER — CLONAZEPAM 2 MG PO TABS: 2.0000 mg | ORAL_TABLET | Freq: Three times a day (TID) | ORAL | Status: DC | PRN

## 2012-04-29 NOTE — Progress Notes (Addendum)
Subjective:    Patient ID: Morgan Kelly, female    DOB: [birth year], 61 y.o.   MRN: [id number]  HPIHere for followup/. Stressed about emergency room visit 2 weeks ago for knee injuries which she feels were treated inadequately She is not given anything additional for pain and so she overused her regular prescription and is running short again She has enough for one to 2 doses a day until her next refill She says both knees were swollen and that the left one was so bad she needed a walker for the last 2 weeks. She is a little better today. There has been no give way  Preferred pain management will defer further treatment at this point awaiting the evaluation by Dr. Otelia Sergeant of her thoracic lesion  Patient Active Problem List  Diagnosis  . Chronic pain  . Asthma in remission  . Edema  . GAD (generalized anxiety disorder)  . Hypothyroid  . Acne  . Other and unspecified hyperlipidemia  . Fibromyalgia  . Eczema  . Mood disorder  . PTSD (post-traumatic stress disorder)  . Depression, major, recurrent  . Abnormal LFTs  . Nicotine abuse  . 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 IBS (irritable bowel syndrome)  . Raynaud's disease and hyperhidrosis resolved by sympathectomy 1990s  . Cervical spondylosis With facet arthropathy  . Degenerative disc disease, cervical   She feels neglected by her sister and is very distressed at this All of her above problems continue to be issues for her at this point in time although she refuses psychological therapy.  She continues to describe her pain as 9/10 often with incomplete pain relief at her current doses  Review of Systems No fever chills or night sweats No weight loss No chest pain or palpitations No nausea vomiting or abdominal pain No urinary symptoms    Objective:   Physical Exam Filed Vitals:   04/29/12 1325  BP: 120/72  Pulse: 82  Temp: 98.6 F (37 C)  Resp: 16   No acute distress and then will burst into tears  during descriptions of her problems/symptoms The left knee is not swollen/She is tender along the lateral and medial aspects of the patella with tenderness on patellar ballottement She is stable to stress source/McMurray's negative       Assessment & Plan:   1. Cervical spondylosis With facet arthropathy   2. Chronic pain   3. Depression, major, recurrent   4. Fibromyalgia   5. GAD (generalized anxiety disorder)   6. Nicotine abuse   7. Mood disorder   8. 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 PTSD (post-traumatic stress disorder)   10.Recent Knee trauma-no new treatment today I discussed with her that using additional chronic pain medicines for acute pain is a violation of most pain contracts . I will not give her extra narcotics to make up the difference. She is most upset at this.  Meds ordered this encounter  Medications  . clonazePAM (KLONOPIN) 2 MG tablet    Sig: Take 1 tablet (2 mg total) by mouth 3 (three) times daily as needed for anxiety.    Dispense:  90 tablet    Refill:  5  . oxycodone (ROXICODONE) 30 MG immediate release tablet    Sig: One in am and one hs    Dispense:  60 tablet    Refill:  0      For 05/05/12 or after  . oxyCODONE (ROXICODONE) 15 MG immediate release tablet  Sig: Take 1 tablet (15 mg total) by mouth every 6 (six) hours as needed for pain.    Dispense:  120 tablet    Refill:  0     For 05/05/12 or after   Followup one month Refuse flu shot Follow with Dr. Otelia Sergeant and then with preferred pain management

## 2012-05-23 ENCOUNTER — Other Ambulatory Visit: Payer: Self-pay | Admitting: Internal Medicine

## 2012-05-29 ENCOUNTER — Telehealth: Payer: Self-pay

## 2012-05-29 NOTE — Telephone Encounter (Signed)
I received a request to do a prior auth to try to get pt's Rx for Ventolin covered (she prefers this to the Pro-air which is covered). I called pt to clarify why she prefers Ventolin and if she has tried/failed others. Pt reported that she tried the Pro-air once and the timing was different on how it was dispensed from inhaler and she was not used to it. Pt stated she already has p/up the Pro-air inhaler and she will try it again and CB if it just doesn't work for her. Faxed note back to [facility] to notify them of pt's decision.

## 2012-06-03 ENCOUNTER — Ambulatory Visit (INDEPENDENT_AMBULATORY_CARE_PROVIDER_SITE_OTHER): Payer: Medicare Other | Admitting: Internal Medicine

## 2012-06-03 ENCOUNTER — Encounter: Payer: Self-pay | Admitting: Internal Medicine

## 2012-06-03 VITALS — BP 122/82 | HR 58 | Temp 97.9°F | Resp 16

## 2012-06-03 DIAGNOSIS — M25569 Pain in unspecified knee: Secondary | ICD-10-CM

## 2012-06-03 DIAGNOSIS — F329 Major depressive disorder, single episode, unspecified: Secondary | ICD-10-CM

## 2012-06-03 DIAGNOSIS — C801 Malignant (primary) neoplasm, unspecified: Secondary | ICD-10-CM

## 2012-06-03 DIAGNOSIS — R945 Abnormal results of liver function studies: Secondary | ICD-10-CM

## 2012-06-03 DIAGNOSIS — T23079A Burn of unspecified degree of unspecified wrist, initial encounter: Secondary | ICD-10-CM

## 2012-06-03 DIAGNOSIS — E039 Hypothyroidism, unspecified: Secondary | ICD-10-CM

## 2012-06-03 DIAGNOSIS — IMO0001 Reserved for inherently not codable concepts without codable children: Secondary | ICD-10-CM

## 2012-06-03 DIAGNOSIS — M47812 Spondylosis without myelopathy or radiculopathy, cervical region: Secondary | ICD-10-CM

## 2012-06-03 DIAGNOSIS — M5030 Other cervical disc degeneration, unspecified cervical region: Secondary | ICD-10-CM

## 2012-06-03 DIAGNOSIS — M797 Fibromyalgia: Secondary | ICD-10-CM

## 2012-06-03 DIAGNOSIS — R7989 Other specified abnormal findings of blood chemistry: Secondary | ICD-10-CM

## 2012-06-03 DIAGNOSIS — F339 Major depressive disorder, recurrent, unspecified: Secondary | ICD-10-CM

## 2012-06-03 DIAGNOSIS — G8929 Other chronic pain: Secondary | ICD-10-CM

## 2012-06-03 LAB — COMPREHENSIVE METABOLIC PANEL
ALT: 16 U/L (ref 0–35)
Albumin: 3.9 g/dL (ref 3.5–5.2)
Alkaline Phosphatase: 102 U/L (ref 39–117)
CO2: 24 mEq/L (ref 19–32)
Glucose, Bld: 100 mg/dL — ABNORMAL HIGH (ref 70–99)
Potassium: 4.6 mEq/L (ref 3.5–5.3)
Sodium: 139 mEq/L (ref 135–145)
Total Protein: 6.8 g/dL (ref 6.0–8.3)

## 2012-06-03 LAB — CBC WITH DIFFERENTIAL/PLATELET
Hemoglobin: 13.4 g/dL (ref 12.0–15.0)
Lymphs Abs: 1.8 10*3/uL (ref 0.7–4.0)
Monocytes Relative: 9 % (ref 3–12)
Neutro Abs: 3.2 10*3/uL (ref 1.7–7.7)
Neutrophils Relative %: 56 % (ref 43–77)
RBC: 4.06 MIL/uL (ref 3.87–5.11)
WBC: 5.7 10*3/uL (ref 4.0–10.5)

## 2012-06-03 MED ORDER — OXYCODONE HCL 15 MG PO TABS: 15.0000 mg | ORAL_TABLET | Freq: Four times a day (QID) | ORAL | Status: DC | PRN

## 2012-06-03 MED ORDER — OXYCODONE HCL 30 MG PO TABS: ORAL_TABLET | ORAL | Status: DC

## 2012-06-03 MED ORDER — CITALOPRAM HYDROBROMIDE 40 MG PO TABS: 40.0000 mg | ORAL_TABLET | Freq: Every day | ORAL | Status: DC

## 2012-06-03 NOTE — Progress Notes (Signed)
Subjective:    Patient ID: Morgan Kelly, female    DOB: December 06, 1950, 61 y.o.   MRN: [id number]  HPI Patient Active Problem List  Diagnosis  . Chronic pain--still has a struggle controlling this at her current doses of medication /is not motivated to attend pain management because of the way she feels she was pre-judged at her initial visit   . Asthma in remission  . Edema-no recent problems   . GAD (generalized anxiety disorder)-see history   . Hypothyroid-time to recheck labs   . Acne-stable   . Other and unspecified hyperlipidemia  . Fibromyalgia-see chronic pain   . Eczema  . Mood disorder-recent depressed mood due to interaction with neighbor over knee injury and to interaction with sister for continued family dysfunction   . PTSD (post-traumatic stress disorder)-same as above   . Depression, major, recurrent  . Abnormal LFTs-thought secondary to hepatic steatosis or perhaps alcohol /Will recheck today   . Nicotine abuse--she is not ready to discontinue smoking   . 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 IBS (irritable bowel syndrome)  . Raynaud's disease and hyperhidrosis resolved by sympathectomy 1990s  . Cervical spondylosis With facet arthropathy  . Degenerative disc disease, cervical   L knee still hurts w/ flexion/no swelling R knee hurts in area of patella Uses braces and doing rehab exercises Able to walk dog OK Optimistic about followup with Dr. Weston Brass to    Review of Systems Burn-right wrist 4 days ago on stove/painful but healing   no headaches No vision changes No chest pain or palpitations/edema has remained absent No wheezing or shortness of breath/no dyspnea on exertion/no new fatigue issues No reflux or dyspepsia/irritable bowel symptoms seem to be stable this week Negative genitourinary No weakness dizziness or syncope Denies suicidal ideation Objective:   Physical  Exam Filed Vitals:   06/03/12 1219  BP: 122/82  Pulse: 58  Temp: 97.9 F (36.6 C)  Resp: 16   Neck with good range of motion though stiff secondary to pain Extremities= L knee without effusion and with negative neg Mc Murray's/ stable to stressors R knee-tender along the medial patellar border with full range of motion and negative stressors R wrist 2 linear blister lesions-no secondary infection      Assessment & Plan:   1. 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 Fibromyalgia  POCT SEDIMENTATION RATE, Rheumatoid factor, ANA, Cyclic citrul peptide antibody, IgG, C-reactive protein  3. Degenerative disc disease, cervical    4. Chronic pain  POCT SEDIMENTATION RATE, Rheumatoid factor, ANA, Cyclic citrul peptide antibody, IgG, C-reactive protein, oxyCODONE (ROXICODONE) 15 MG immediate release tablet, oxycodone (ROXICODONE) 30 MG immediate release tablet  5. Cervical spondylosis With facet arthropathy    6. Abnormal LFTs  Comprehensive metabolic panel  7. Hypothyroid  TSH, T4, Free  8. Depression  citalopram (CELEXA) 40 MG tablet  9. Burn of wrist  OTC  10. Continued knee pain post injury   will continue her rehabilitation exercises and        Walking   Meds ordered this encounter  Medications  . meloxicam (MOBIC) 15 MG tablet    Sig: Take 15 mg by mouth daily.  Marland Kitchen oxyCODONE (ROXICODONE) 15 MG immediate release tablet    Sig: Take 1 tablet (15 mg total) by mouth every 6 (six) hours as needed for pain.    Dispense:  120 tablet  Refill:  0  . oxycodone (ROXICODONE) 30 MG immediate release tablet    Sig: One in am and one hs    Dispense:  60 tablet    Refill:  0  . citalopram (CELEXA) 40 MG tablet    Sig: Take 1 tablet (40 mg total) by mouth daily.    Dispense:  90 tablet    Refill:  3   Followup with Dr. Otelia Sergeant as planned Still needs to transition to chronic pain management Is very resistant to further  psychological therapy Followup one month  45 minute office visit

## 2012-06-04 LAB — RHEUMATOID FACTOR: Rhuematoid fact SerPl-aCnc: <10 IU/mL (ref <=14)

## 2012-06-04 LAB — CYCLIC CITRUL PEPTIDE ANTIBODY, IGG: Cyclic Citrullin Peptide Ab: <2 U/mL (ref 0.0–5.0)

## 2012-06-04 LAB — C-REACTIVE PROTEIN: CRP: <0.5 mg/dL (ref <0.60)

## 2012-06-05 LAB — IMMUNOFIXATION ELECTROPHORESIS: IgA: 252 mg/dL (ref 69–380)

## 2012-06-07 ENCOUNTER — Encounter: Payer: Self-pay | Admitting: Internal Medicine

## 2012-07-01 ENCOUNTER — Ambulatory Visit (INDEPENDENT_AMBULATORY_CARE_PROVIDER_SITE_OTHER): Payer: Medicare Other | Admitting: Internal Medicine

## 2012-07-01 ENCOUNTER — Encounter: Payer: Self-pay | Admitting: Internal Medicine

## 2012-07-01 VITALS — BP 130/76 | HR 81 | Temp 97.9°F | Resp 16 | Ht 60.5 in | Wt 139.0 lb

## 2012-07-01 DIAGNOSIS — F411 Generalized anxiety disorder: Secondary | ICD-10-CM

## 2012-07-01 DIAGNOSIS — G8929 Other chronic pain: Secondary | ICD-10-CM

## 2012-07-01 DIAGNOSIS — E039 Hypothyroidism, unspecified: Secondary | ICD-10-CM

## 2012-07-01 DIAGNOSIS — R0789 Other chest pain: Secondary | ICD-10-CM

## 2012-07-01 DIAGNOSIS — F39 Unspecified mood [affective] disorder: Secondary | ICD-10-CM

## 2012-07-01 DIAGNOSIS — F419 Anxiety disorder, unspecified: Secondary | ICD-10-CM

## 2012-07-01 DIAGNOSIS — F339 Major depressive disorder, recurrent, unspecified: Secondary | ICD-10-CM

## 2012-07-01 DIAGNOSIS — K130 Diseases of lips: Secondary | ICD-10-CM

## 2012-07-01 DIAGNOSIS — R945 Abnormal results of liver function studies: Secondary | ICD-10-CM

## 2012-07-01 DIAGNOSIS — F4310 Post-traumatic stress disorder, unspecified: Secondary | ICD-10-CM

## 2012-07-01 DIAGNOSIS — R071 Chest pain on breathing: Secondary | ICD-10-CM

## 2012-07-01 MED ORDER — LEVOTHYROXINE SODIUM 200 MCG PO TABS: 200.0000 ug | ORAL_TABLET | Freq: Every day | ORAL | Status: DC

## 2012-07-01 MED ORDER — VALACYCLOVIR HCL 1 G PO TABS: 1000.0000 mg | ORAL_TABLET | Freq: Two times a day (BID) | ORAL | Status: DC

## 2012-07-01 MED ORDER — ALPRAZOLAM 2 MG PO TABS: 2.0000 mg | ORAL_TABLET | Freq: Three times a day (TID) | ORAL | Status: DC | PRN

## 2012-07-01 MED ORDER — OXYCODONE HCL 30 MG PO TABS: ORAL_TABLET | ORAL | Status: DC

## 2012-07-01 MED ORDER — OXYCODONE HCL 15 MG PO TABS: 15.0000 mg | ORAL_TABLET | Freq: Four times a day (QID) | ORAL | Status: DC | PRN

## 2012-07-01 MED ORDER — AMITRIPTYLINE HCL 50 MG PO TABS: ORAL_TABLET | ORAL | Status: DC

## 2012-07-01 NOTE — Progress Notes (Signed)
Subjective:    Patient ID: Morgan Kelly, female    DOB: Oct 17, 1950, 62 y.o.   MRN: [id number]  HPILeft ant-lat chest wall Pain like knives for the past 3 weeks/no chg breathing/no pain with movement/certain prolonged positions seem to exacerbate. Sporadic. Fell again/on uneven ground/bruise L hip but no effect on gait-now better May have broken toe but does not want it xrayed Recent flu like illness followed by fever blister R lower lip for past 2 days/painful  Anxiety uncontrolled for last 2 weeks-troubles w/ sister/tired of so many health problems/ When she gets panicky she has the feelings of all over L side-face,arms,legs of Pins and needles. occas on R side. No weakness. Resolves after minutes to 1-2 hours. She feels like klonopin doesn't work and wants to return to xanax.  Sleep very disrupted-hard to fall and wakes freq Continues w/ celexa and thinks it offers some help with her lows  followup for other chronic problems= 1. Hypothyroid -recent TSH sl low but t3,t4 wnl  2. Chronic pain -time for pain med refill- Continues with neck,upper and lower back pain that is related to bony disease and to fibromyalgia/ will f/u with Dr Otelia Sergeant now that testing is completed to consider best options. Has been back to [facility] Pain Mgmt but says they decided to do nothing until ortho eval complete  3.. Mood disorder -I continued to offer referral to psychiatry for the possibility of better medication management but she feels she has seen too many over the years and does not think there is anything to gain. She feels the same [id number] psych was B Jen Mow  4 PTSD (post-traumatic stress disorder)   5 Depression, major, recurrent   6 Abnormal LFTs-due to steatosis but labs last visit had returned to normal     Bone density done 2 d ago/tried Mammo but hurt(?so didn't complete) Review of Systems  Constitutional: Negative for fever, activity change, appetite change and unexpected weight change.    HENT: Negative for trouble swallowing and voice change.   Eyes: Negative for visual disturbance.  Respiratory: Negative for shortness of breath.   Cardiovascular: Negative for palpitations and leg swelling.  Gastrointestinal: Negative for abdominal pain, diarrhea and constipation.  Genitourinary: Negative for difficulty urinating.  Skin: Negative for rash.  Neurological: Negative for tremors, syncope, weakness and headaches.  Psychiatric/Behavioral: Negative for suicidal ideas, hallucinations and self-injury.       Objective:   Physical Exam Filed Vitals:   07/01/12 0957  BP: 130/76  Pulse: 81  Temp: 97.9 F (36.6 C)  Resp: 16  wt 139 No acute distress tho obviously unhappy Perrla/eoms conj Nares and throat clear but there is a cluster of tender vesicles at R lower lip No nodes or thyromeg Neck ROM adequate Tender over parasp muscles C to L as usual Skin over back in mottled with hyperpigmentation(once again I suggest ref to derm for biopsy to r/o connection to internal disorder But she refuses) Minimal tenderness over L ant-lat chest where current new complaint of pain-pain occurs there without me touching it during exam Lungs are clear w/out rub//Ht regular Abd supple without LUQ tenderness Extr-no edema/good perip pulses/no sens or motor losses CN 2-12 intact Gait intact/no cerebellar losses Mood-frustrated,angry,anxious/not in despair but discouraged Expressing external locus of control for all problems       Assessment & Plan:   1. Hypothyroid -cont same dose levothyroxine (SYNTHROID, LEVOTHROID) 200 MCG tablet  2. Chronic pain -no changes oxycodone (ROXICODONE) 30 MG immediate release tablet,  oxyCODONE (ROXICODONE) 15 MG immediate release tablet///takes 30 am and hs and 15 mg q4 during day during the day  3. 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 Anxiety -with insomnia discussed benzo tolerance/offered additional meds and settled on  amitripty  amitriptyline (ELAVIL) 50 MG tablet--to take 50-100 hs, alprazolam (XANAX) 2 MG tablet tid prn(inst of Klon) Contin celexa  5. GAD (generalized anxiety disorder)    6. Mood disorder    7. PTSD (post-traumatic stress disorder)    8. Depression, major, recurrent    9. Abnormal LFTs    10. Chest wall pain -seems neurologic in nature/?radicular but without clear definition by exam   11. Unusual skin rash-back  Suggested numerous referrals for potentially helpful opinions Derm Psychiatry Neurology But she is unwilling at this point Will continue with Dr Otelia Sergeant and then pain management F/u here in 99month Meds ordered this encounter  Medications  . levothyroxine (SYNTHROID, LEVOTHROID) 200 MCG tablet    Sig: Take 1 tablet (200 mcg total) by mouth daily.    Dispense:  90 tablet    Refill:  3  . valACYclovir (VALTREX) 1000 MG tablet    Sig: Take 1 tablet (1,000 mg total) by mouth 2 (two) times daily.    Dispense:  14 tablet    Refill:  0  . amitriptyline (ELAVIL) 50 MG tablet    Sig: 1-2 at bedtime    Dispense:  60 tablet    Refill:  1  . oxycodone (ROXICODONE) 30 MG immediate release tablet    Sig: One in am and one hs    Dispense:  60 tablet    Refill:  0  . oxyCODONE (ROXICODONE) 15 MG immediate release tablet    Sig: Take 1 tablet (15 mg total) by mouth every 6 (six) hours as needed for pain.    Dispense:  120 tablet    Refill:  0  . alprazolam (XANAX) 2 MG tablet    Sig: Take 1 tablet (2 mg total) by mouth 3 (three) times daily as needed for sleep.    Dispense:  90 tablet    Refill:  0   45 minute OV

## 2012-07-02 NOTE — Addendum Note (Signed)
Addended by: Tonye Pearson on: 07/02/2012 12:18 PM   Modules accepted: Level of Service

## 2012-07-14 ENCOUNTER — Other Ambulatory Visit: Payer: Self-pay | Admitting: Internal Medicine

## 2012-07-14 NOTE — Telephone Encounter (Signed)
Dr. Merla Riches, do you want to refill this again? Telephone message from 04/07/12 states ok to RF x 3 months.

## 2012-07-14 NOTE — Telephone Encounter (Signed)
Patient is requesting septra  [facility]  Wants it by 11:00 am today.   [phone]

## 2012-07-22 ENCOUNTER — Ambulatory Visit (INDEPENDENT_AMBULATORY_CARE_PROVIDER_SITE_OTHER): Payer: Medicare Other | Admitting: Internal Medicine

## 2012-07-22 ENCOUNTER — Encounter: Payer: Self-pay | Admitting: Internal Medicine

## 2012-07-22 VITALS — BP 140/77 | HR 81 | Temp 98.7°F | Resp 18 | Wt 133.0 lb

## 2012-07-22 DIAGNOSIS — F411 Generalized anxiety disorder: Secondary | ICD-10-CM

## 2012-07-22 DIAGNOSIS — J45909 Unspecified asthma, uncomplicated: Secondary | ICD-10-CM

## 2012-07-22 DIAGNOSIS — F39 Unspecified mood [affective] disorder: Secondary | ICD-10-CM

## 2012-07-22 DIAGNOSIS — M47812 Spondylosis without myelopathy or radiculopathy, cervical region: Secondary | ICD-10-CM

## 2012-07-22 DIAGNOSIS — F339 Major depressive disorder, recurrent, unspecified: Secondary | ICD-10-CM

## 2012-07-22 DIAGNOSIS — J45998 Other asthma: Secondary | ICD-10-CM

## 2012-07-22 DIAGNOSIS — M5030 Other cervical disc degeneration, unspecified cervical region: Secondary | ICD-10-CM

## 2012-07-22 DIAGNOSIS — L709 Acne, unspecified: Secondary | ICD-10-CM

## 2012-07-22 DIAGNOSIS — G8929 Other chronic pain: Secondary | ICD-10-CM

## 2012-07-22 MED ORDER — OXYCODONE HCL 15 MG PO TABS: 15.0000 mg | ORAL_TABLET | Freq: Four times a day (QID) | ORAL | Status: DC | PRN

## 2012-07-22 MED ORDER — OXYCODONE HCL 30 MG PO TABS: ORAL_TABLET | ORAL | Status: DC

## 2012-07-22 NOTE — Progress Notes (Addendum)
Subjective:    Patient ID: Morgan Kelly, female    DOB: 1951/02/08, 62 y.o.   MRN: [id number]  HPIf/u 1. Chronic pain    2. Asthma in remission    3. Cervical spondylosis With facet arthropathy    4. Degenerative disc disease, cervical    5. Depression, major, recurrent    6. GAD (generalized anxiety disorder)    7. Mood disorder    8. Acne     Prior to Admission medications   Medication Sig Start Date End Date Taking? Authorizing Provider  alprazolam Prudy Feeler) 2 MG tablet Take 1 tablet (2 mg total) by mouth 3 (three) times daily as needed for sleep. 07/01/12  may use smaller doses during the day and a full dose at bedtime to prevent side effects  Yes Tonye Pearson, MD  amitriptyline (ELAVIL) 50 MG tablet 1-2 at bedtime 07/01/12  uses most of the time  Yes Tonye Pearson, MD  belladonna alk-PHENObarbital (DONNATAL) 16.2 MG tablet Take 1 tablet by mouth every 8 (eight) hours as needed.  02/05/12  using less than 2-3 per week  Yes Raymon Mutton Dunn, PA-C  citalopram (CELEXA) 40 MG tablet Take 1 tablet (40 mg total) by mouth daily. 06/03/12  Yes Tonye Pearson, MD  CLEOCIN-T 1 % lotion APPLY SPARINGLY AND MASSAGE IN TWICE A DAY 10/31/11  Yes Ryan M Dunn, PA-C  diphenoxylate-atropine (LOMOTIL) 2.5-0.025 MG per tablet Take 1 tablet by mouth 4 (four) times daily as needed. diarrhea 03/06/12  Yes Tonye Pearson, MD  Fluticasone-Salmeterol (ADVAIR) 250-50 MCG/DOSE AEPB Inhale 1 puff into the lungs every 12 (twelve) hours. 09/04/11  continues to need this daily  Yes Tonye Pearson, MD  LASIX 20 MG tablet TAKE 1 TABLET EVERY 3 DAYS AS NEEDED. 04/02/12  less than once a week now Yes Eleanore E Debbra Riding, PA-C  levothyroxine (SYNTHROID, LEVOTHROID) 200 MCG tablet Take 1 tablet (200 mcg total) by mouth daily. 07/01/12  Yes Tonye Pearson, MD  lidocaine (LIDODERM) 5 % Use 3 patches a day. Remove & Discard patch within 12 hours or as directed by MD 09/04/11  Yes Tonye Pearson, MD  loratadine  (CLARITIN) 10 MG tablet Take 10 mg by mouth daily.    Yes Historical Provider, MD  meloxicam (MOBIC) 15 MG tablet Take 15 mg by mouth daily.   Yes Historical Provider, MD  oxyCODONE (ROXICODONE) 15 MG immediate release tablet Take 1 tablet (15 mg total) by mouth every 6 (six) hours as needed for pain. For 07/29/12 07/22/12  Yes Tonye Pearson, MD  oxycodone (ROXICODONE) 30 MG immediate release tablet One in am and one hs/for 07/29/12 07/22/12  Yes Tonye Pearson, MD  SINGULAIR 10 MG tablet TAKE ONE TABLET AT BEDTIME. 08/29/11  Yes Morrell Riddle, PA-C  spironolactone (ALDACTONE) 100 MG tablet Take 100 mg by mouth daily. PER PT USE PRN 09/04/11  Yes Tonye Pearson, MD  sulfamethoxazole-trimethoprim (BACTRIM DS) 800-160 MG per tablet TAKE (1) TABLET TWICE A DAY AS NEEDED. 07/14/12  this continues to make quite a difference with her acne  Yes Tonye Pearson, MD  VENTOLIN HFA 108 (90 BASE) MCG/ACT inhaler USE 1 TO 2 PUFFS EVERY 4 TO 6 HOURS AS NEEDED. 05/23/12  Yes Morrell Riddle, PA-C     Pain continues to migrate to different areas with nerve and muscle components as well as bony components. Feels like current pain medications are working well enough for her to stay  active. Not happy about referred pain management him the options presented.  Trying to do more things with sister to improve relationship/went to hear her sing recently Smoking-1/2 to 1 pack/willing to consider decreasing but not firmly committed  Discussed obliterating the necessary cigarettes  Discussed effects on breathing and potential for getting off Advair  Review of Systems Depth perception bad/worsening No headaches No chest pain or palpitations Cycles between diarrhea and constipation No urinary problems Although she complains of dropping things frequently she does not notice any sensory loss in her upper extremities     Objective:   Physical Exam Vital signs blood pressure 140/77, weight 133 HEENT clear Heart  regular Neck with decreased range of motion due to stiffness Sensory intact in upper extremities/grips okay equal Mood is good/affect is colored by chronic pain and chronic psychiatric difficulties No current suicide ideation       Assessment & Plan:   1. Chronic pain    2. Asthma in remission    3. Cervical spondylosis With facet arthropathy    4. Degenerative disc disease, cervical    5. Depression, major, recurrent   no change in medication   6. GAD (generalized anxiety disorder)   no change in medication   7. Mood disorder    8. Acne     Smoking cessation discussion 4 minutes Meds ordered this encounter  Medications  . oxycodone (ROXICODONE) 30 MG immediate release tablet    Sig: One in am and one hs/for 07/29/12    Dispense:  60 tablet    Refill:  0  . oxyCODONE (ROXICODONE) 15 MG immediate release tablet    Sig: Take 1 tablet (15 mg total) by mouth every 6 (six) hours as needed for pain. For 07/29/12    Dispense:  120 tablet    Refill:  0   these prescriptions were left in and of low at 102 for her to pick up next Wednesday F/u 3/5 and 4/2 opthalm Dr Otelia Sergeant tomorrow Mammo 2/3

## 2012-07-24 ENCOUNTER — Telehealth: Payer: Self-pay

## 2012-07-24 NOTE — Telephone Encounter (Signed)
Dr. Merla Riches, patient wants you to know that her appt with Dr. Otelia Sergeant was changed to today and she went to her appt but she left because she sat in the room for an hour and 20 minutes and she had somewhere else to be at 11:30. States she will reschedule her appt and she just wanted you to know that she did go but she had to leave because it took too long.

## 2012-08-08 ENCOUNTER — Telehealth: Payer: Self-pay

## 2012-08-08 DIAGNOSIS — F419 Anxiety disorder, unspecified: Secondary | ICD-10-CM

## 2012-08-08 NOTE — Telephone Encounter (Signed)
Patient states she needs a refill on her alprazolam. She only has 1 tablet left. She was given amitriptyline to experiment with but it gave her nightmares and she really doesn't want to take it anymore. She's been cutting up her alprazolam into squares and quarters but since she only has one left she needs more and her pharmacy has been faxing requests for a week. Call back # is [phone]. Sees Dr. Merla Riches.

## 2012-08-09 ENCOUNTER — Telehealth: Payer: Self-pay

## 2012-08-09 MED ORDER — ALPRAZOLAM 2 MG PO TABS: 2.0000 mg | ORAL_TABLET | Freq: Three times a day (TID) | ORAL | Status: DC | PRN

## 2012-08-09 NOTE — Telephone Encounter (Signed)
PT IS OUT OF ANXIETY MEDS; CALLED YESTERDAY AND NO RETURN CALL YET; STATES THAT HER PHARMACY FAXED SOMETHING TO OUR OFFICE 5X ALREADY....

## 2012-08-09 NOTE — Telephone Encounter (Signed)
Meds ordered this encounter  Medications  . alprazolam (XANAX) 2 MG tablet    Sig: Take 1 tablet (2 mg total) by mouth 3 (three) times daily as needed for anxiety.    Dispense:  90 tablet    Refill:  0

## 2012-08-10 NOTE — Telephone Encounter (Signed)
Faxed to [facility] 

## 2012-08-22 ENCOUNTER — Telehealth: Payer: Self-pay

## 2012-08-22 NOTE — Telephone Encounter (Signed)
THIS CALL WAS RECEIVED FROM THE ANSWERING SERVICE. Morgan Kelly CALLED TO SAY SHE HAS AN APPOINTMENT WITH DR. Merla Riches ON WED. MARCH 5TH. SHE NEEDS SOME EXTRA TIME? BEST PHONE [phone] (HOME)  MBC

## 2012-08-26 ENCOUNTER — Encounter: Payer: Self-pay | Admitting: Internal Medicine

## 2012-08-26 ENCOUNTER — Ambulatory Visit (INDEPENDENT_AMBULATORY_CARE_PROVIDER_SITE_OTHER): Payer: Medicare Other | Admitting: Internal Medicine

## 2012-08-26 ENCOUNTER — Telehealth: Payer: Self-pay

## 2012-08-26 VITALS — BP 123/71 | HR 87 | Temp 97.8°F | Resp 16 | Ht 62.0 in | Wt 134.2 lb

## 2012-08-26 DIAGNOSIS — F339 Major depressive disorder, recurrent, unspecified: Secondary | ICD-10-CM

## 2012-08-26 DIAGNOSIS — F39 Unspecified mood [affective] disorder: Secondary | ICD-10-CM

## 2012-08-26 DIAGNOSIS — C801 Malignant (primary) neoplasm, unspecified: Secondary | ICD-10-CM

## 2012-08-26 DIAGNOSIS — F411 Generalized anxiety disorder: Secondary | ICD-10-CM

## 2012-08-26 DIAGNOSIS — G8929 Other chronic pain: Secondary | ICD-10-CM

## 2012-08-26 MED ORDER — OXYCODONE HCL 15 MG PO TABS: 15.0000 mg | ORAL_TABLET | Freq: Four times a day (QID) | ORAL | Status: DC | PRN

## 2012-08-26 MED ORDER — OXYCODONE HCL 30 MG PO TABS: ORAL_TABLET | ORAL | Status: DC

## 2012-08-26 NOTE — Telephone Encounter (Signed)
Pt needs Alprazolam to [facility] please advise she wants 4-5 days early

## 2012-08-26 NOTE — Telephone Encounter (Signed)
Error

## 2012-08-26 NOTE — Progress Notes (Signed)
  Subjective:    Patient ID: Morgan Kelly, female    DOB: [birth year], 62 y.o.   MRN: [id number]  HPIneck pain then sciatica over last 5 weeks restricting activity-eventually wore off--no ortho checkup as advised Over the past 2 days this pain has remitted without any new treatment Continues to be crippled by psychological issues as well Is alone with no friends or family support she feels/bad relationship with sister/mother recently deceased Many of her friends were from substance abuse and recovery areas anyway Continues to refuse referral for psychological help Has no money Expresses a fatalistic view about any potential spinal problems Patient Active Problem List  Diagnosis  . Chronic pain  . Asthma in remission  . Edema  . GAD (generalized anxiety disorder)  . Hypothyroid  . Acne  . Other and unspecified hyperlipidemia  . Fibromyalgia  . Eczema  . Mood disorder  . PTSD (post-traumatic stress disorder)  . Depression, major, recurrent  . Abnormal LFTs  . Nicotine abuse  . 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 IBS (irritable bowel syndrome)  . Raynaud's disease and hyperhidrosis resolved by sympathectomy 1990s  . Cervical spondylosis With facet arthropathy  . Degenerative disc disease, cervical      Review of Systems No fever chills or night sweats No weight loss Denies alcohol abuse No neurological losses reported    Objective:   Physical Exam BP 123/71  Pulse 87  Temp(Src) 97.8 F (36.6 C) (Oral)  Resp 16  Ht 5\' 2"  (1.575 m)  Wt 134 lb 3.2 oz (60.873 kg)  BMI 24.54 kg/m2  SpO2 96% In tears during most of the visit Is generally tender to palpation neck and lower back as is typical for past exams Peripheral neurology seems stable       Assessment & Plan:  Chronic pain - At this point she needs refills for the next month  Plan: oxyCODONE (ROXICODONE) 15 MG immediate release tablet, oxycodone (ROXICODONE) 30 MG immediate release tablet GAD  (generalized anxiety disorder)  No change in medication Mood disorder  No change in medication 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 Otelia Sergeant Depression, major, recurrent  No change in medication  The biggest problem here is overcoming her numerous psychological obstacles so that she can get some sort of plan for the future Continue to urge counseling, further psychiatric consultation

## 2012-08-26 NOTE — Telephone Encounter (Signed)
Patient forgot to get one her meds refilled this morning at her appt and she is needing it.  Patient states that its not due for a refill until 3/14 but is needing it to help her sleep.     Best#: [phone]

## 2012-08-27 ENCOUNTER — Telehealth: Payer: Self-pay

## 2012-08-27 DIAGNOSIS — F419 Anxiety disorder, unspecified: Secondary | ICD-10-CM

## 2012-08-27 NOTE — Telephone Encounter (Signed)
Pt would like [place] to refill her xanex and write the date on the rx for 03-13 or 03-14 she said [facility] would take the rx with the early date on it please call patient

## 2012-08-29 MED ORDER — ALPRAZOLAM 2 MG PO TABS: 2.0000 mg | ORAL_TABLET | Freq: Three times a day (TID) | ORAL | Status: DC | PRN

## 2012-08-29 NOTE — Telephone Encounter (Signed)
Meds ordered this encounter  Medications  . alprazolam (XANAX) 2 MG tablet    Sig: Take 1 tablet (2 mg total) by mouth 3 (three) times daily as needed for anxiety. Ok to fill on 09/04/12    Dispense:  90 tablet    Refill:  0

## 2012-08-31 NOTE — Telephone Encounter (Signed)
Faxed to [facility] 

## 2012-09-07 ENCOUNTER — Other Ambulatory Visit: Payer: Self-pay | Admitting: Internal Medicine

## 2012-09-16 ENCOUNTER — Telehealth: Payer: Self-pay

## 2012-09-16 NOTE — Telephone Encounter (Signed)
Pharm requesting RF of Lomotil. Do you want to RF?

## 2012-09-16 NOTE — Telephone Encounter (Signed)
Received letter for [employer] stating Ventolin HFA is not on their formulary.  Will need to change or request an exception.  Patient have received this letter as well.  FYI only

## 2012-09-17 ENCOUNTER — Encounter: Payer: Self-pay | Admitting: Internal Medicine

## 2012-09-18 NOTE — Telephone Encounter (Signed)
Did not get answer or voice mail will try again later.

## 2012-09-18 NOTE — Telephone Encounter (Signed)
Called her again.

## 2012-09-18 NOTE — Telephone Encounter (Signed)
Sent denial to pharmacy with note to have patient contact our office so I can advise.

## 2012-09-18 NOTE — Telephone Encounter (Signed)
Called her to advise.  

## 2012-09-18 NOTE — Telephone Encounter (Signed)
Still no answer

## 2012-09-18 NOTE — Telephone Encounter (Signed)
No refill for immodium--if she is needing this as much as was prescribed then we should reevaluate her GI tract and see if she needs referral to GI for chronic diarrhea

## 2012-09-19 ENCOUNTER — Telehealth: Payer: Self-pay

## 2012-09-19 NOTE — Telephone Encounter (Signed)
Looked through previous messages and seen attempts to reach her regarding refill of lomotil. See previous message. Patient notified and voiced understanding.

## 2012-09-19 NOTE — Telephone Encounter (Signed)
PATIENT STATES SHE CALLED AND LEFT A MESSAGE ON THURS. SHE CALLED BACK AGAIN ON Friday AND SOMEONE HAD HER ON HOLD FOR A LONG TIME. SHE IS CALLING BACK AGAIN. BEST PHONE [phone] (HOME)   PHARMACY CHOICE IS [facility].  MBC

## 2012-09-23 ENCOUNTER — Ambulatory Visit: Payer: Medicare Other | Admitting: Internal Medicine

## 2012-09-23 ENCOUNTER — Ambulatory Visit (INDEPENDENT_AMBULATORY_CARE_PROVIDER_SITE_OTHER): Payer: Medicare Other | Admitting: Internal Medicine

## 2012-09-23 ENCOUNTER — Encounter: Payer: Self-pay | Admitting: Internal Medicine

## 2012-09-23 VITALS — BP 118/62 | HR 82 | Temp 98.1°F | Resp 18 | Ht 62.0 in | Wt 136.0 lb

## 2012-09-23 DIAGNOSIS — F419 Anxiety disorder, unspecified: Secondary | ICD-10-CM

## 2012-09-23 DIAGNOSIS — G8929 Other chronic pain: Secondary | ICD-10-CM

## 2012-09-23 DIAGNOSIS — F39 Unspecified mood [affective] disorder: Secondary | ICD-10-CM

## 2012-09-23 DIAGNOSIS — M5030 Other cervical disc degeneration, unspecified cervical region: Secondary | ICD-10-CM

## 2012-09-23 DIAGNOSIS — M797 Fibromyalgia: Secondary | ICD-10-CM

## 2012-09-23 DIAGNOSIS — K589 Irritable bowel syndrome without diarrhea: Secondary | ICD-10-CM

## 2012-09-23 DIAGNOSIS — F411 Generalized anxiety disorder: Secondary | ICD-10-CM

## 2012-09-23 DIAGNOSIS — F339 Major depressive disorder, recurrent, unspecified: Secondary | ICD-10-CM

## 2012-09-23 DIAGNOSIS — IMO0001 Reserved for inherently not codable concepts without codable children: Secondary | ICD-10-CM

## 2012-09-23 DIAGNOSIS — L709 Acne, unspecified: Secondary | ICD-10-CM

## 2012-09-23 DIAGNOSIS — L708 Other acne: Secondary | ICD-10-CM

## 2012-09-23 DIAGNOSIS — F4310 Post-traumatic stress disorder, unspecified: Secondary | ICD-10-CM

## 2012-09-23 DIAGNOSIS — R609 Edema, unspecified: Secondary | ICD-10-CM

## 2012-09-23 MED ORDER — SULFAMETHOXAZOLE-TMP DS 800-160 MG PO TABS: ORAL_TABLET | ORAL | Status: DC

## 2012-09-23 MED ORDER — OXYCODONE HCL 30 MG PO TABS: ORAL_TABLET | ORAL | Status: DC

## 2012-09-23 MED ORDER — DIPHENOXYLATE-ATROPINE 2.5-0.025 MG PO TABS: 1.0000 | ORAL_TABLET | Freq: Four times a day (QID) | ORAL | Status: DC | PRN

## 2012-09-23 MED ORDER — ALPRAZOLAM 2 MG PO TABS: 2.0000 mg | ORAL_TABLET | Freq: Three times a day (TID) | ORAL | Status: DC | PRN

## 2012-09-23 MED ORDER — SPIRONOLACTONE 100 MG PO TABS: 100.0000 mg | ORAL_TABLET | Freq: Every day | ORAL | Status: DC

## 2012-09-23 MED ORDER — FUROSEMIDE 20 MG PO TABS: ORAL_TABLET | ORAL | Status: DC

## 2012-09-23 MED ORDER — OXYCODONE HCL 15 MG PO TABS: 15.0000 mg | ORAL_TABLET | Freq: Four times a day (QID) | ORAL | Status: DC | PRN

## 2012-09-23 NOTE — Progress Notes (Addendum)
Subjective:    Patient ID: Morgan Kelly, female    DOB: 08/29/1950, 62 y.o.   MRN: [id number]  HPIhere for f/u  Chronic pain/Fibromyalgia/Degenerative disc disease, cervical/spondylosis spine in general -stable on pain meds//has not f/u w/ dr Thomos Lemons concerns re abn MRI--not c/o pain there in last mo//no fevers or night sweats or wt loss  IBS (irritable bowel syndrome)--first diagnosed at age 12/was not a problem during middle age/has recurred over the last several years/unpredictable onset/afraid to leave the house when it hits/Imodium ineffective/uses Lomotil 2-3 times a week to control/  Acne- on sulfamethoxazole-trimethoprim (BACTRIM DS) 800-160 MG per tablet, spironolactone (ALDACTONE) 100 MG tablet from [facility] for years and just needs refills /is better and uses meds intermittently  Idiopathic edema -hands and feet/continues to control w/ intermittent lasix//worse w/ increased salt intake  PTSD (post-traumatic stress disorder) Mood disorder--Elavil has not helped sleep or pain or mood and she feels is causing weird perceptions/occas confusion/night terrors/ so has stopped and sxt have resolved GAD (generalized anxiety disorder)--stable on xanax tho greatly increased stress w/ struggling w/ sister over estate of MOM///will consider ref to [employer] and will get legal counsel///reapplying for food stamps Depression, major, recurrent--continues on Celexa--stable tho symptoms daily///still refuses Psychiatry-psychology referrals      Review of Systems No weight loss    No fever chills or night sweats No cough The shortness of breath No chest pain or palpitations   Objective:   Physical Exam BP 118/62  Pulse 82  Temp(Src) 98.1 F (36.7 C) (Oral)  Resp 18  Ht 5\' 2"  (1.575 m)  Wt 136 lb (61.689 kg)  BMI 24.87 kg/m2  SpO2 96% HEENT clear Heart regular without murmur No peripheral edema noted Cranial nerves II through XII intact Mood is upbeat though not without  tears during exam/affect is unpredictable No suicide ideation       Assessment & Plan:   Chronic pain - Plan: oxyCODONE (ROXICODONE) 15 MG immediate release tablet, oxycodone (ROXICODONE) 30 MG immediate release tablet  IBS (irritable bowel syndrome) - Plan: diphenoxylate-atropine (LOMOTIL) 2.5-0.025 MG per tablet, Acne  Idiopathic edema - Plan: furosemide (LASIX) 20 MG tablet  PTSD (post-traumatic stress disorder)  Mood disorder  GAD (generalized anxiety disorder)  Fibromyalgia  Edema  Degenerative disc disease, cervical  Depression, major, recurrent  Meds ordered this encounter  Medications  . alprazolam (XANAX) 2 MG tablet    Sig: Take 1 tablet (2 mg total) by mouth 3 (three) times daily as needed for anxiety. Ok to fill on 10/04/12    Dispense:  90 tablet    Refill:  2  . diphenoxylate-atropine (LOMOTIL) 2.5-0.025 MG per tablet    Sig: Take 1 tablet by mouth 4 (four) times daily as needed. diarrhea    Dispense:  30 tablet    Refill:  0  . sulfamethoxazole-trimethoprim (BACTRIM DS) 800-160 MG per tablet    Sig: TAKE (1) TABLET TWICE A DAY AS NEEDED.    Dispense:  60 tablet    Refill:  5  . spironolactone (ALDACTONE) 100 MG tablet    Sig: Take 1 tablet (100 mg total) by mouth daily. PER PT USE PRN    Dispense:  30 tablet    Refill:  5  . furosemide (LASIX) 20 MG tablet    Sig: TAKE 1 TABLET EVERY 3 DAYS AS NEEDED.    Dispense:  30 tablet    Refill:  1  . oxyCODONE (ROXICODONE) 15 MG immediate release tablet  Sig: Take 1 tablet (15 mg total) by mouth every 6 (six) hours as needed for pain. For 09/25/12    Dispense:  120 tablet    Refill:  0  . oxycodone (ROXICODONE) 30 MG immediate release tablet    Sig: One in am and one hs/for 09/25/12    Dispense:  60 tablet    Refill:  0   F/u 1 mo F/u Dr Otelia Sergeant

## 2012-09-24 ENCOUNTER — Telehealth: Payer: Self-pay

## 2012-09-24 NOTE — Telephone Encounter (Signed)
Dr. Merla Kelly, Pt wants you to know that she is going to social services in the morning (4/4) at 7 a.m. With her sister. She said you can call her on Saturday if you want to.

## 2012-10-18 ENCOUNTER — Telehealth: Payer: Self-pay

## 2012-10-18 NOTE — Telephone Encounter (Signed)
Call Monday to check on her and if still confused have her go to [facility] ER for evaluation

## 2012-10-18 NOTE — Telephone Encounter (Signed)
PATIENT CALLED STATING THAT SOMETHING WAS WRONG WITH HER.  SHE WAS VERY DISORIENTED, TALKED VERY SLOWLY AND SLURRED SOME OF HER WORDS.  SHE REPEATED THAT IT WAS NIGHT TIME SEVERAL TIMES.  SAID THAT SHE "HAD GOTTEN OUT OF BED SIX TIMES TONIGHT AND HAD FELL DOWN EVERY TIME."  SHE SAID SHE HAD OR HAS AN APPOINTMENT WITH [facility] AT 12.  I TOLD HER TODAY WAS Sunday AND SHE WAS EXTREMELY SURPRISED.  SHE STATED, "SEE, SOMETHING IS WRONG."  I LOOKED UP HER APPOINTMENT AND TOLD HER THAT IT IS WITH [facility] ON Wednesday, April 30 AT 12.  SHE SEEMED TO UNDERSTAND AND THANKED ME.  SHE DID NOT REQUEST A CALL BACK.

## 2012-10-18 NOTE — Telephone Encounter (Signed)
FYI

## 2012-10-19 NOTE — Telephone Encounter (Signed)
There is no emergency contact information, would have tried this.

## 2012-10-19 NOTE — Telephone Encounter (Signed)
Called patient, no answer. No recording/ Amy

## 2012-10-19 NOTE — Telephone Encounter (Signed)
Tried to call pt at home number. No answer. Will try again.

## 2012-10-20 NOTE — Telephone Encounter (Signed)
Patient not returning my calls  

## 2012-10-21 ENCOUNTER — Ambulatory Visit (INDEPENDENT_AMBULATORY_CARE_PROVIDER_SITE_OTHER): Payer: Medicare Other | Admitting: Internal Medicine

## 2012-10-21 ENCOUNTER — Encounter: Payer: Self-pay | Admitting: Internal Medicine

## 2012-10-21 VITALS — BP 144/86 | HR 82 | Temp 98.1°F | Resp 16 | Ht 60.5 in | Wt 137.5 lb

## 2012-10-21 DIAGNOSIS — C801 Malignant (primary) neoplasm, unspecified: Secondary | ICD-10-CM

## 2012-10-21 DIAGNOSIS — F4310 Post-traumatic stress disorder, unspecified: Secondary | ICD-10-CM

## 2012-10-21 DIAGNOSIS — M5030 Other cervical disc degeneration, unspecified cervical region: Secondary | ICD-10-CM

## 2012-10-21 DIAGNOSIS — F339 Major depressive disorder, recurrent, unspecified: Secondary | ICD-10-CM

## 2012-10-21 DIAGNOSIS — S300XXA Contusion of lower back and pelvis, initial encounter: Secondary | ICD-10-CM

## 2012-10-21 DIAGNOSIS — IMO0001 Reserved for inherently not codable concepts without codable children: Secondary | ICD-10-CM

## 2012-10-21 DIAGNOSIS — K589 Irritable bowel syndrome without diarrhea: Secondary | ICD-10-CM

## 2012-10-21 DIAGNOSIS — M47812 Spondylosis without myelopathy or radiculopathy, cervical region: Secondary | ICD-10-CM

## 2012-10-21 DIAGNOSIS — G8929 Other chronic pain: Secondary | ICD-10-CM

## 2012-10-21 DIAGNOSIS — M797 Fibromyalgia: Secondary | ICD-10-CM

## 2012-10-21 DIAGNOSIS — F411 Generalized anxiety disorder: Secondary | ICD-10-CM

## 2012-10-21 DIAGNOSIS — Z720 Tobacco use: Secondary | ICD-10-CM

## 2012-10-21 DIAGNOSIS — F39 Unspecified mood [affective] disorder: Secondary | ICD-10-CM

## 2012-10-21 MED ORDER — OXYCODONE HCL 15 MG PO TABS: 15.0000 mg | ORAL_TABLET | Freq: Four times a day (QID) | ORAL | Status: DC | PRN

## 2012-10-21 MED ORDER — OXYCODONE HCL 30 MG PO TABS: ORAL_TABLET | ORAL | Status: DC

## 2012-10-21 MED ORDER — OXYCODONE HCL 20 MG PO TABS: 20.0000 mg | ORAL_TABLET | Freq: Four times a day (QID) | ORAL | Status: DC

## 2012-10-21 NOTE — Patient Instructions (Addendum)
Start referral process for Psychiatry Refilled meds-ok for 10/23/12 appt 6/4--medsfor refill can be picked up at 102 desk on 11/22/12 or after
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
## 2012-10-30 ENCOUNTER — Other Ambulatory Visit: Payer: Self-pay | Admitting: Physician Assistant

## 2012-10-30 ENCOUNTER — Other Ambulatory Visit: Payer: Self-pay | Admitting: Internal Medicine

## 2012-10-30 NOTE — Telephone Encounter (Signed)
Dr Merla Riches, I didn't see this med on pt's med list from 10/21/12 OV so wanted to check with you before sending in any RFs.

## 2012-11-02 NOTE — Telephone Encounter (Signed)
Meds ordered this encounter  Medications  . amitriptyline (ELAVIL) 50 MG tablet    Sig: TAKE 1 OR 2 TABLETS AT BEDTIME.    Dispense:  60 tablet    Refill:  5

## 2012-11-12 ENCOUNTER — Telehealth: Payer: Self-pay

## 2012-11-12 DIAGNOSIS — G8929 Other chronic pain: Secondary | ICD-10-CM

## 2012-11-12 NOTE — Telephone Encounter (Signed)
Dr. Merla Riches: patient called to remind you to put her prescriptions in the pick up drawer before you go out of town.  Her number is [phone]

## 2012-11-13 MED ORDER — OXYCODONE HCL 15 MG PO TABS: 15.0000 mg | ORAL_TABLET | Freq: Four times a day (QID) | ORAL | Status: DC | PRN

## 2012-11-13 MED ORDER — OXYCODONE HCL 30 MG PO TABS: ORAL_TABLET | ORAL | Status: DC

## 2012-11-13 NOTE — Telephone Encounter (Signed)
/    Meds ordered this encounter  Medications  . oxycodone (ROXICODONE) 30 MG immediate release tablet    Sig: One in am and one hs/for 11/22/12    Dispense:  60 tablet    Refill:  0  . oxyCODONE (ROXICODONE) 15 MG immediate release tablet    Sig: Take 1 tablet (15 mg total) by mouth every 6 (six) hours as needed for pain. For 11/22/12    Dispense:  120 tablet    Refill:  0   To pick up

## 2012-11-13 NOTE — Telephone Encounter (Signed)
Patient advised.

## 2012-11-25 ENCOUNTER — Ambulatory Visit: Payer: Medicare Other | Admitting: Internal Medicine

## 2012-12-01 ENCOUNTER — Telehealth: Payer: Self-pay
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
## 2012-12-02 NOTE — Telephone Encounter (Signed)
Please advise 

## 2012-12-12 ENCOUNTER — Telehealth: Payer: Self-pay

## 2012-12-12 NOTE — Telephone Encounter (Signed)
Patient states she has a yeast infection and does not have a car to make the trip out here. Wants to know if something can be called in to [facility]. Contact #: [phone]

## 2012-12-12 NOTE — Telephone Encounter (Signed)
She would like flagyl, monistat has not worked for her in the past.

## 2012-12-13 NOTE — Telephone Encounter (Signed)
Spoke with patient and explained can not treat over the phone would need to come in to be seen so know what needs to be treated for and is placed on correct medication. She stated she could not come in until Tuesday and this has been going on for 3 wks. She has tried Monistat and it has not helped. She has a d/c and "fishy" vaginal odor. Tried to explain she would need to come in and she said "I will be there on Tuesday" and hung up on me.

## 2012-12-17 ENCOUNTER — Telehealth: Payer: Self-pay

## 2012-12-17 ENCOUNTER — Other Ambulatory Visit: Payer: Self-pay | Admitting: Internal Medicine

## 2012-12-17 DIAGNOSIS — G8929 Other chronic pain: Secondary | ICD-10-CM

## 2012-12-17 NOTE — Telephone Encounter (Signed)
Patient would like to get her med refilled until she can come in and see dr Merla Riches.  Best# Steward Drone her sister)-[phone]

## 2012-12-17 NOTE — Telephone Encounter (Signed)
Dr Merla Riches will be in on Monday. At 4pm. Called patients sister. She will have her here on Monday. Patient requesting refills on both forms of Oxycodone please advise.

## 2012-12-17 NOTE — Telephone Encounter (Signed)
Patient is asking for Merla Riches to please let her come in sometime on Friday to see him for refills. Says she has no car and that her sister has to bring her.  Says every time she calls here [facility]'s schedule has changed so that is why she didn't get in any sooner.  Please call [phone]

## 2012-12-17 NOTE — Telephone Encounter (Signed)
Pt is really wanting dr Merla Riches to write her meds and put them in the drawer for her to pick up on Friday so that she can have something in her system when she comes in on Monday to see doolittle at 4  Best number [phone]

## 2012-12-18 MED ORDER — OXYCODONE HCL 15 MG PO TABS: 15.0000 mg | ORAL_TABLET | Freq: Four times a day (QID) | ORAL | Status: DC | PRN

## 2012-12-18 MED ORDER — OXYCODONE HCL 30 MG PO TABS: ORAL_TABLET | ORAL | Status: DC

## 2012-12-18 NOTE — Telephone Encounter (Signed)
To pick up Meds ordered this encounter  Medications  . oxycodone (ROXICODONE) 30 MG immediate release tablet    Sig: One in am and one hs/for 12/22/12    Dispense:  60 tablet    Refill:  0  . oxyCODONE (ROXICODONE) 15 MG immediate release tablet    Sig: Take 1 tablet (15 mg total) by mouth every 6 (six) hours as needed for pain. For 12/22/12    Dispense:  120 tablet    Refill:  0

## 2012-12-18 NOTE — Telephone Encounter (Signed)
rx at front desk, phone busy.

## 2012-12-18 NOTE — Telephone Encounter (Signed)
oxycodone (ROXICODONE) 30 MG immediate release tablet     Sig: One in am and one hs/for 11/22/12     Dispense: 60 tablet     Refill: 0   .  oxyCODONE (ROXICODONE) 15 MG immediate release tablet     Sig: Take 1 tablet (15 mg total) by mouth every 6 (six) hours as needed for pain. For 11/22/12     Dispense: 120 tablet     Refill: 0     These were given to her early this month to last til 7/1 or 7/2 Can leave rx  For 7/1 for her so she can choose to come in later in July Printed at 104

## 2012-12-21 ENCOUNTER — Ambulatory Visit (INDEPENDENT_AMBULATORY_CARE_PROVIDER_SITE_OTHER): Payer: Medicare Other | Admitting: Internal Medicine

## 2012-12-21 VITALS — BP 132/72 | HR 82 | Temp 98.0°F | Resp 16 | Ht 61.0 in | Wt 138.2 lb

## 2012-12-21 DIAGNOSIS — IMO0001 Reserved for inherently not codable concepts without codable children: Secondary | ICD-10-CM

## 2012-12-21 DIAGNOSIS — M797 Fibromyalgia: Secondary | ICD-10-CM

## 2012-12-21 DIAGNOSIS — G8929 Other chronic pain: Secondary | ICD-10-CM

## 2012-12-21 DIAGNOSIS — F4310 Post-traumatic stress disorder, unspecified: Secondary | ICD-10-CM

## 2012-12-21 DIAGNOSIS — F339 Major depressive disorder, recurrent, unspecified: Secondary | ICD-10-CM

## 2012-12-21 DIAGNOSIS — F411 Generalized anxiety disorder: Secondary | ICD-10-CM

## 2012-12-21 DIAGNOSIS — F39 Unspecified mood [affective] disorder: Secondary | ICD-10-CM

## 2012-12-21 MED ORDER — FLUCONAZOLE 150 MG PO TABS: 150.0000 mg | ORAL_TABLET | Freq: Every day | ORAL | Status: DC

## 2012-12-21 NOTE — Patient Instructions (Addendum)
7/30 10am appt

## 2012-12-21 NOTE — Progress Notes (Signed)
  Subjective:    Patient ID: Morgan Kelly, female    DOB: 1951/05/20, 62 y.o.   MRN: [id number]  HPImed refills: Chronic pain GAD (generalized anxiety disorder) Fibromyalgia Mood disorder PTSD (post-traumatic stress disorder) Depression, major, recurrent  Better this month/meds on schedule Getting along w/ sister Not c/o of tspine recently-never got bx  Recent fall with bruise of buttock//not dizzy/no numbness  Review of Systems Recent yeast sxtoms with itching/no SA 10 yr at least//has avoided GYN followup  and I stressed the need to get this scheduled    Objective:   Physical Exam BP 132/72  Pulse 82  Temp(Src) 98 F (36.7 C) (Oral)  Resp 16  Ht 5\' 1"  (1.549 m)  Wt 138 lb 3.2 oz (62.687 kg)  BMI 26.13 kg/m2  SpO2 97% Back stable/neck stable/generalized tenderness to palpation of muscles of the back Gait normal Cranial nerves intact Mood stable/affect is still frustrated and somewhat depressed       Assessment & Plan:  Chronic pain  GAD (generalized anxiety disorder)  Fibromyalgia  Mood disorder  PTSD (post-traumatic stress disorder)  Depression, major, recurrent  Picked up pain meds to fill today Meds ordered this encounter  Medications  . fluconazole (DIFLUCAN) 150 MG tablet    Sig: Take 1 tablet (150 mg total) by mouth daily. For 3 days    Dispense:  3 tablet    Refill:  0    oxycodone (ROXICODONE) 30 MG immediate release tablet     Sig: One in am and one hs/for 12/22/12     Dispense: 60 tablet     Refill: 0   .  oxyCODONE (ROXICODONE) 15 MG immediate release tablet     Sig: Take 1 tablet (15 mg total) by mouth every 6 (six) hours as needed for pain. For 12/22/12     Dispense: 120 tablet     Refill: 0   F/u 1-2 mo     Gyn ref soon//prefers a female-has avoided for years /will call when has new car-predictable transportation

## 2012-12-23 NOTE — Progress Notes (Signed)
Made appointment per dr doolittle's request.

## 2012-12-29 ENCOUNTER — Telehealth: Payer: Self-pay

## 2012-12-29 NOTE — Telephone Encounter (Signed)
PATIENT IS REQUESTING A REFILL ON FLUCONAZOLE 150MG . SHE STATES DR. Merla Riches GAVE IT TO HER A WHILE AGO FOR A VAGINAL INFECTION. IT  RETURNED ON Sunday WITH THE ODOR AND ITCHING.  BEST PHONE [phone] (HOME)  PHARMACY CHOICE IS [facility].   MBC

## 2012-12-30 ENCOUNTER — Telehealth: Payer: Self-pay

## 2012-12-30 NOTE — Telephone Encounter (Signed)
Pharm reqs RF of alprazolam 2 mg.

## 2012-12-30 NOTE — Telephone Encounter (Signed)
Can not do this without office visit. Called her to advise.

## 2012-12-31 NOTE — Telephone Encounter (Signed)
Ok if its time

## 2013-01-01 ENCOUNTER — Telehealth: Payer: Self-pay | Admitting: Radiology

## 2013-01-01 DIAGNOSIS — F419 Anxiety disorder, unspecified: Secondary | ICD-10-CM

## 2013-01-01 NOTE — Telephone Encounter (Signed)
[facility] faxed request for Alprazolam, please advise.

## 2013-01-03 MED ORDER — ALPRAZOLAM 2 MG PO TABS: 2.0000 mg | ORAL_TABLET | Freq: Three times a day (TID) | ORAL | Status: DC | PRN

## 2013-01-03 NOTE — Telephone Encounter (Signed)
Meds ordered this encounter  Medications  . alprazolam (XANAX) 2 MG tablet    Sig: Take 1 tablet (2 mg total) by mouth 3 (three) times daily as needed for anxiety.    Dispense:  90 tablet    Refill:  2

## 2013-01-04 NOTE — Telephone Encounter (Signed)
rx faxed

## 2013-01-05 NOTE — Telephone Encounter (Signed)
Rx was sent in 01/03/13.

## 2013-01-11 ENCOUNTER — Emergency Department (HOSPITAL_COMMUNITY): Payer: Medicare Other

## 2013-01-11 ENCOUNTER — Encounter (HOSPITAL_COMMUNITY): Payer: Self-pay | Admitting: Family Medicine

## 2013-01-11 ENCOUNTER — Inpatient Hospital Stay (HOSPITAL_COMMUNITY)
Admission: EM | Admit: 2013-01-11 | Discharge: 2013-01-18 | DRG: 481 | Disposition: A | Discharge status: Home Health | Payer: Medicare Other | Attending: Orthopedic Surgery | Admitting: Orthopedic Surgery

## 2013-01-11 DIAGNOSIS — F17200 Nicotine dependence, unspecified, uncomplicated: Secondary | ICD-10-CM | POA: Diagnosis present

## 2013-01-11 DIAGNOSIS — E785 Hyperlipidemia, unspecified: Secondary | ICD-10-CM | POA: Diagnosis present

## 2013-01-11 DIAGNOSIS — F339 Major depressive disorder, recurrent, unspecified: Secondary | ICD-10-CM | POA: Diagnosis present

## 2013-01-11 DIAGNOSIS — S7292XA Unspecified fracture of left femur, initial encounter for closed fracture: Secondary | ICD-10-CM

## 2013-01-11 DIAGNOSIS — IMO0001 Reserved for inherently not codable concepts without codable children: Secondary | ICD-10-CM | POA: Diagnosis present

## 2013-01-11 DIAGNOSIS — S72409A Unspecified fracture of lower end of unspecified femur, initial encounter for closed fracture: Secondary | ICD-10-CM

## 2013-01-11 DIAGNOSIS — E039 Hypothyroidism, unspecified: Secondary | ICD-10-CM | POA: Diagnosis present

## 2013-01-11 DIAGNOSIS — R945 Abnormal results of liver function studies: Secondary | ICD-10-CM

## 2013-01-11 DIAGNOSIS — B379 Candidiasis, unspecified: Secondary | ICD-10-CM | POA: Diagnosis present

## 2013-01-11 DIAGNOSIS — Z86711 Personal history of pulmonary embolism: Secondary | ICD-10-CM

## 2013-01-11 DIAGNOSIS — Z720 Tobacco use: Secondary | ICD-10-CM

## 2013-01-11 DIAGNOSIS — S63279A Dislocation of unspecified interphalangeal joint of unspecified finger, initial encounter: Secondary | ICD-10-CM | POA: Diagnosis present

## 2013-01-11 DIAGNOSIS — S060XAA Concussion with loss of consciousness status unknown, initial encounter: Secondary | ICD-10-CM | POA: Diagnosis present

## 2013-01-11 DIAGNOSIS — S060X9A Concussion with loss of consciousness of unspecified duration, initial encounter: Secondary | ICD-10-CM | POA: Diagnosis present

## 2013-01-11 DIAGNOSIS — F4310 Post-traumatic stress disorder, unspecified: Secondary | ICD-10-CM | POA: Diagnosis present

## 2013-01-11 DIAGNOSIS — D6859 Other primary thrombophilia: Secondary | ICD-10-CM | POA: Diagnosis present

## 2013-01-11 DIAGNOSIS — S72453A Displaced supracondylar fracture without intracondylar extension of lower end of unspecified femur, initial encounter for closed fracture: Principal | ICD-10-CM | POA: Diagnosis present

## 2013-01-11 DIAGNOSIS — Y9301 Activity, walking, marching and hiking: Secondary | ICD-10-CM

## 2013-01-11 DIAGNOSIS — F411 Generalized anxiety disorder: Secondary | ICD-10-CM | POA: Diagnosis present

## 2013-01-11 DIAGNOSIS — J45909 Unspecified asthma, uncomplicated: Secondary | ICD-10-CM | POA: Diagnosis present

## 2013-01-11 DIAGNOSIS — S7290XA Unspecified fracture of unspecified femur, initial encounter for closed fracture: Secondary | ICD-10-CM

## 2013-01-11 DIAGNOSIS — E871 Hypo-osmolality and hyponatremia: Secondary | ICD-10-CM | POA: Diagnosis not present

## 2013-01-11 DIAGNOSIS — S63105A Unspecified dislocation of left thumb, initial encounter: Secondary | ICD-10-CM

## 2013-01-11 DIAGNOSIS — N179 Acute kidney failure, unspecified: Secondary | ICD-10-CM | POA: Diagnosis present

## 2013-01-11 DIAGNOSIS — M797 Fibromyalgia: Secondary | ICD-10-CM | POA: Diagnosis present

## 2013-01-11 DIAGNOSIS — G894 Chronic pain syndrome: Secondary | ICD-10-CM | POA: Diagnosis present

## 2013-01-11 DIAGNOSIS — S72402A Unspecified fracture of lower end of left femur, initial encounter for closed fracture: Secondary | ICD-10-CM

## 2013-01-11 DIAGNOSIS — D62 Acute posthemorrhagic anemia: Secondary | ICD-10-CM | POA: Diagnosis not present

## 2013-01-11 DIAGNOSIS — E870 Hyperosmolality and hypernatremia: Secondary | ICD-10-CM | POA: Diagnosis not present

## 2013-01-11 DIAGNOSIS — N390 Urinary tract infection, site not specified: Secondary | ICD-10-CM | POA: Diagnosis not present

## 2013-01-11 DIAGNOSIS — G8929 Other chronic pain: Secondary | ICD-10-CM | POA: Diagnosis present

## 2013-01-11 DIAGNOSIS — IMO0002 Reserved for concepts with insufficient information to code with codable children: Secondary | ICD-10-CM

## 2013-01-11 HISTORY — DX: Other pulmonary embolism without acute cor pulmonale: I26.99

## 2013-01-11 LAB — CREATININE, SERUM
GFR calc Af Amer: 47 mL/min — ABNORMAL LOW (ref >90)
GFR calc non Af Amer: 41 mL/min — ABNORMAL LOW (ref >90)

## 2013-01-11 LAB — URINALYSIS, ROUTINE W REFLEX MICROSCOPIC
Hgb urine dipstick: NEGATIVE
Ketones, ur: 15 mg/dL — AB
Nitrite: NEGATIVE
Protein, ur: 100 mg/dL — AB
Specific Gravity, Urine: <1.005 — ABNORMAL LOW (ref 1.005–1.030)
Specific Gravity, Urine: <1.005 — ABNORMAL LOW (ref 1.005–1.030)
Urobilinogen, UA: 0.2 mg/dL (ref 0.0–1.0)
Urobilinogen, UA: 0.2 mg/dL (ref 0.0–1.0)
pH: 5.5 (ref 5.0–8.0)

## 2013-01-11 LAB — CBC WITH DIFFERENTIAL/PLATELET
Basophils Absolute: 0 10*3/uL (ref 0.0–0.1)
Basophils Relative: 0 % (ref 0–1)
Eosinophils Absolute: 0 10*3/uL (ref 0.0–0.7)
Eosinophils Relative: 0 % (ref 0–5)
HCT: 42 % (ref 36.0–46.0)
MCH: 32.9 pg (ref 26.0–34.0)
MCHC: 34.3 g/dL (ref 30.0–36.0)
MCV: 95.9 fL (ref 78.0–100.0)
Monocytes Absolute: 1.4 10*3/uL — ABNORMAL HIGH (ref 0.1–1.0)
Neutro Abs: 14.6 10*3/uL — ABNORMAL HIGH (ref 1.7–7.7)
RDW: 13.4 % (ref 11.5–15.5)

## 2013-01-11 LAB — CBC
HCT: 38.9 % (ref 36.0–46.0)
MCHC: 34.2 g/dL (ref 30.0–36.0)
Platelets: 403 10*3/uL — ABNORMAL HIGH (ref 150–400)
RDW: 13.6 % (ref 11.5–15.5)
WBC: 10.8 10*3/uL — ABNORMAL HIGH (ref 4.0–10.5)

## 2013-01-11 LAB — POCT I-STAT, CHEM 8
Calcium, Ion: 1.15 mmol/L (ref 1.13–1.30)
Creatinine, Ser: 1.3 mg/dL — ABNORMAL HIGH (ref 0.50–1.10)
Hemoglobin: 16 g/dL — ABNORMAL HIGH (ref 12.0–15.0)
Sodium: 137 mEq/L (ref 135–145)
TCO2: 24 mmol/L (ref 0–100)

## 2013-01-11 LAB — ETHANOL: Alcohol, Ethyl (B): <11 mg/dL (ref 0–11)

## 2013-01-11 LAB — URINE MICROSCOPIC-ADD ON

## 2013-01-11 LAB — RAPID URINE DRUG SCREEN, HOSP PERFORMED
Barbiturates: POSITIVE — AB
Tetrahydrocannabinol: NOT DETECTED

## 2013-01-11 MED ORDER — MOMETASONE FURO-FORMOTEROL FUM 100-5 MCG/ACT IN AERO
2.0000 | INHALATION_SPRAY | Freq: Two times a day (BID) | RESPIRATORY_TRACT | Status: DC
  Administered 2013-01-12 – 2013-01-18 (×12): 2 via RESPIRATORY_TRACT
  Filled 2013-01-11 (×2): qty 8.8

## 2013-01-11 MED ORDER — LACTATED RINGERS IV SOLN
INTRAVENOUS | Status: DC
  Administered 2013-01-11 – 2013-01-13 (×3): via INTRAVENOUS

## 2013-01-11 MED ORDER — IOHEXOL 300 MG/ML  SOLN
100.0000 mL | Freq: Once | INTRAMUSCULAR | Status: AC | PRN
  Administered 2013-01-11: 100 mL via INTRAVENOUS

## 2013-01-11 MED ORDER — MORPHINE SULFATE 4 MG/ML IJ SOLN
4.0000 mg | Freq: Once | INTRAMUSCULAR | Status: AC
  Administered 2013-01-11: 4 mg via INTRAVENOUS
  Filled 2013-01-11: qty 1

## 2013-01-11 MED ORDER — ONDANSETRON HCL 4 MG/2ML IJ SOLN: 4.0000 mg | Freq: Four times a day (QID) | INTRAMUSCULAR | Status: DC | PRN

## 2013-01-11 MED ORDER — OXYCODONE HCL 5 MG PO TABS
10.0000 mg | ORAL_TABLET | ORAL | Status: DC | PRN
  Administered 2013-01-11 – 2013-01-12 (×6): 10 mg via ORAL
  Filled 2013-01-11 (×7): qty 2

## 2013-01-11 MED ORDER — METHOCARBAMOL 500 MG PO TABS
500.0000 mg | ORAL_TABLET | Freq: Three times a day (TID) | ORAL | Status: DC | PRN
  Administered 2013-01-11 – 2013-01-12 (×2): 500 mg via ORAL
  Filled 2013-01-11 (×2): qty 1

## 2013-01-11 MED ORDER — ALBUTEROL SULFATE HFA 108 (90 BASE) MCG/ACT IN AERS
1.0000 | INHALATION_SPRAY | Freq: Four times a day (QID) | RESPIRATORY_TRACT | Status: DC | PRN
  Administered 2013-01-14 – 2013-01-15 (×2): 2 via RESPIRATORY_TRACT
  Administered 2013-01-15: 1 via RESPIRATORY_TRACT
  Administered 2013-01-16: 2 via RESPIRATORY_TRACT
  Filled 2013-01-11: qty 6.7

## 2013-01-11 MED ORDER — MONTELUKAST SODIUM 10 MG PO TABS
10.0000 mg | ORAL_TABLET | Freq: Every day | ORAL | Status: DC
  Administered 2013-01-11 – 2013-01-17 (×7): 10 mg via ORAL
  Filled 2013-01-11 (×9): qty 1

## 2013-01-11 MED ORDER — LORATADINE 10 MG PO TABS
10.0000 mg | ORAL_TABLET | Freq: Every day | ORAL | Status: DC | PRN
  Administered 2013-01-13: 10 mg via ORAL
  Filled 2013-01-11: qty 1

## 2013-01-11 MED ORDER — NICOTINE 21 MG/24HR TD PT24
21.0000 mg | MEDICATED_PATCH | Freq: Once | TRANSDERMAL | Status: DC
  Administered 2013-01-11: 21 mg via TRANSDERMAL
  Filled 2013-01-11: qty 1

## 2013-01-11 MED ORDER — LEVOTHYROXINE SODIUM 200 MCG PO TABS
200.0000 ug | ORAL_TABLET | Freq: Every day | ORAL | Status: DC
  Administered 2013-01-13 – 2013-01-18 (×5): 200 ug via ORAL
  Filled 2013-01-11 (×8): qty 1

## 2013-01-11 MED ORDER — DIPHENOXYLATE-ATROPINE 2.5-0.025 MG PO TABS
1.0000 | ORAL_TABLET | Freq: Four times a day (QID) | ORAL | Status: DC | PRN
Start: 2013-01-11 — End: 2013-01-18

## 2013-01-11 MED ORDER — ONDANSETRON HCL 4 MG PO TABS: 4.0000 mg | ORAL_TABLET | Freq: Four times a day (QID) | ORAL | Status: DC | PRN

## 2013-01-11 MED ORDER — HYDROMORPHONE HCL PF 1 MG/ML IJ SOLN
1.0000 mg | Freq: Once | INTRAMUSCULAR | Status: AC
  Administered 2013-01-11: 1 mg via INTRAVENOUS
  Filled 2013-01-11: qty 1

## 2013-01-11 MED ORDER — ALPRAZOLAM 0.5 MG PO TABS
2.0000 mg | ORAL_TABLET | Freq: Three times a day (TID) | ORAL | Status: DC | PRN
  Administered 2013-01-11 – 2013-01-18 (×9): 2 mg via ORAL
  Filled 2013-01-11 (×9): qty 4

## 2013-01-11 MED ORDER — CITALOPRAM HYDROBROMIDE 40 MG PO TABS
40.0000 mg | ORAL_TABLET | Freq: Every day | ORAL | Status: DC
  Administered 2013-01-11 – 2013-01-18 (×6): 40 mg via ORAL
  Filled 2013-01-11 (×8): qty 1

## 2013-01-11 MED ORDER — ENOXAPARIN SODIUM 40 MG/0.4ML ~~LOC~~ SOLN
40.0000 mg | SUBCUTANEOUS | Status: DC
  Administered 2013-01-11 – 2013-01-12 (×3): 40 mg via SUBCUTANEOUS
  Filled 2013-01-11 (×3): qty 0.4

## 2013-01-11 MED ORDER — AMITRIPTYLINE HCL 50 MG PO TABS
50.0000 mg | ORAL_TABLET | Freq: Every day | ORAL | Status: DC
  Administered 2013-01-11 – 2013-01-15 (×5): 100 mg via ORAL
  Administered 2013-01-16: 50 mg via ORAL
  Filled 2013-01-11 (×8): qty 2
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
## 2013-01-11 NOTE — ED Notes (Signed)
Ortho called for splint  

## 2013-01-11 NOTE — Consult Note (Signed)
I personally examined the patient and reviewed her chart.  Her examination is unremarkable except for some upper back pain and her leg pain.  She has been stable throughout her stay there although low blood pressure were recorded.  These have been attributed to erroneous readings., and the nurse taking care of her state that she has been stable throughout.  Because she continued to have upper thoracic spine pain (which is where here chronic pain resides) we have ordered a thoracic spine CT.  All here other scans are negative for acute injury.  The patient does not need to be admitted to the trauma service for her isolated orthopedic injury.  Feel free to reconsult [place] if her condition should change.  This patient has been seen and I agree with the findings and treatment plan.  Marta Lamas. Gae Bon, MD, FACS [phone] (pager) [phone] (direct pager) Trauma Surgeon
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
## 2013-01-11 NOTE — Progress Notes (Signed)
Orthopedic Tech Progress Note Patient Details:  [name] June 17, 1951 [id number] Applied posterior long leg splint to LLE.  Pulses, sensation, motion intact distally before and after placement.  Verbal orders received to cancel knee immobilizer and short leg splint. Ortho Devices Type of Ortho Device: Long leg splint Ortho Device/Splint Location: LLE Ortho Device/Splint Interventions: Application   Lesle Chris 01/11/2013, 1:46 PM
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
## 2013-01-12 ENCOUNTER — Encounter (HOSPITAL_COMMUNITY)
Admission: EM | Disposition: A | Discharge status: Home Health | Payer: Self-pay | Source: Home / Self Care | Attending: Orthopedic Surgery

## 2013-01-12 ENCOUNTER — Encounter (HOSPITAL_COMMUNITY): Payer: Self-pay | Admitting: Orthopedic Surgery

## 2013-01-12 ENCOUNTER — Encounter (HOSPITAL_COMMUNITY): Payer: Self-pay | Admitting: Anesthesiology

## 2013-01-12 ENCOUNTER — Inpatient Hospital Stay (HOSPITAL_COMMUNITY): Payer: Medicare Other

## 2013-01-12 LAB — CBC
HCT: 33.9 % — ABNORMAL LOW (ref 36.0–46.0)
Hemoglobin: 11.7 g/dL — ABNORMAL LOW (ref 12.0–15.0)
MCH: 33.1 pg (ref 26.0–34.0)
MCHC: 34.5 g/dL (ref 30.0–36.0)

## 2013-01-12 LAB — BASIC METABOLIC PANEL
BUN: 19 mg/dL (ref 6–23)
Creatinine, Ser: 0.95 mg/dL (ref 0.50–1.10)
GFR calc Af Amer: 73 mL/min — ABNORMAL LOW (ref >90)
GFR calc non Af Amer: 63 mL/min — ABNORMAL LOW (ref >90)
Potassium: 3.7 mEq/L (ref 3.5–5.1)

## 2013-01-12 SURGERY — OPEN REDUCTION INTERNAL FIXATION (ORIF) DISTAL FEMUR FRACTURE
Anesthesia: General | Laterality: Left

## 2013-01-12 MED ORDER — NALOXONE HCL 0.4 MG/ML IJ SOLN: 0.4000 mg | INTRAMUSCULAR | Status: DC | PRN

## 2013-01-12 MED ORDER — LIDOCAINE HCL (PF) 1 % IJ SOLN
INTRAMUSCULAR | Status: AC
  Administered 2013-01-12: 21:00:00
  Filled 2013-01-12: qty 5

## 2013-01-12 MED ORDER — LIDOCAINE HCL (PF) 1 % IJ SOLN
INTRAMUSCULAR | Status: AC
  Administered 2013-01-12: 20:00:00
  Filled 2013-01-12: qty 5

## 2013-01-12 MED ORDER — SODIUM CHLORIDE 0.9 % IJ SOLN: 9.0000 mL | INTRAMUSCULAR | Status: DC | PRN

## 2013-01-12 MED ORDER — DIPHENHYDRAMINE HCL 12.5 MG/5ML PO ELIX: 12.5000 mg | ORAL_SOLUTION | Freq: Four times a day (QID) | ORAL | Status: DC | PRN

## 2013-01-12 MED ORDER — HYDROMORPHONE 0.3 MG/ML IV SOLN
INTRAVENOUS | Status: AC
  Administered 2013-01-12: 17:00:00 via INTRAVENOUS
  Administered 2013-01-12: 1.19 mg via INTRAVENOUS
  Administered 2013-01-13: 0.599 mg via INTRAVENOUS
  Administered 2013-01-13: 15:00:00 via INTRAVENOUS
  Administered 2013-01-13: 2 mg via INTRAVENOUS
  Administered 2013-01-13: 2.19 mg via INTRAVENOUS
  Administered 2013-01-13: 1.19 mg via INTRAVENOUS
  Administered 2013-01-13: 1.59 mg via INTRAVENOUS
  Administered 2013-01-14: 1.99 mg via INTRAVENOUS
  Administered 2013-01-14: 16:00:00 via INTRAVENOUS
  Administered 2013-01-14: 2.79 mg via INTRAVENOUS
  Administered 2013-01-15: 04:00:00 via INTRAVENOUS
  Administered 2013-01-15: 0.6 mg via INTRAVENOUS
  Administered 2013-01-15: 1.79 mg via INTRAVENOUS
  Administered 2013-01-15: 0.999 mg via INTRAVENOUS
  Administered 2013-01-15: 1.19 mg via INTRAVENOUS
  Administered 2013-01-15: 1.59 mg via INTRAVENOUS
  Administered 2013-01-15: 0.6 mg via INTRAVENOUS
  Filled 2013-01-12 (×3): qty 25

## 2013-01-12 MED ORDER — DIPHENHYDRAMINE HCL 50 MG/ML IJ SOLN: 12.5000 mg | Freq: Four times a day (QID) | INTRAMUSCULAR | Status: DC | PRN

## 2013-01-12 MED ORDER — METHOCARBAMOL 500 MG PO TABS
500.0000 mg | ORAL_TABLET | Freq: Four times a day (QID) | ORAL | Status: DC | PRN
  Administered 2013-01-12: 1000 mg via ORAL
  Administered 2013-01-12: 500 mg via ORAL
  Administered 2013-01-13 – 2013-01-15 (×4): 1000 mg via ORAL
  Filled 2013-01-12 (×5): qty 2
  Filled 2013-01-12: qty 1

## 2013-01-12 MED ORDER — OXYCODONE HCL 5 MG PO TABS
10.0000 mg | ORAL_TABLET | ORAL | Status: DC | PRN
  Administered 2013-01-12 (×2): 10 mg via ORAL
  Administered 2013-01-13 – 2013-01-16 (×14): 20 mg via ORAL
  Filled 2013-01-12 (×3): qty 4
  Filled 2013-01-12 (×2): qty 2
  Filled 2013-01-12 (×11): qty 4
  Filled 2013-01-12: qty 2

## 2013-01-12 MED ORDER — LIDOCAINE HCL 1 % IJ SOLN
5.0000 mL | Freq: Once | INTRAMUSCULAR | Status: DC
  Filled 2013-01-12: qty 5

## 2013-01-12 MED ORDER — ONDANSETRON HCL 4 MG/2ML IJ SOLN: 4.0000 mg | Freq: Four times a day (QID) | INTRAMUSCULAR | Status: DC | PRN

## 2013-01-12 NOTE — Progress Notes (Addendum)
Family Medicine Teaching Service Daily Progress Note Intern Pager: [phone]  Patient name: Morgan Kelly Medical record number: [id number] Date of birth: 1950-09-12 Age: 62 y.o. Gender: female  Primary Care Provider: Tonye Pearson, MD Code Status: Full Code  Pt Overview and Major Events to Date:   Assessment and Plan: Morgan Kelly is a 62 y.o. year old female presenting with supracondylar fx with   #Supracondylar Fx, L leg - Managed by Orthopaedics, will continue to follow along - Pain per primary team -f/u OR today or Wednesday - PT/OT per primary   # Hx of Asthma  -Continue home medications  - singulair  - claritin  - dulera  - proventil  #Hx of GAD/Depression - Continue home Xanax, Celexa, Elavil   #Hx of Hypothyroidism? -Pt on synthroid 200 mcg in outpatient setting -No formal dx of this, but will continue while inpatient   #Pre-operative assessment/AKI on admission -No hx of MI/CAD/CHF/arrhythmia/AVB/Vavular dx, h/o CAD/CHF/DM.  However, does smoke 1 ppd for last 14 yrs.  Will tx w/ nicotine patch -Elevated creatinine on admission to 1.30. Last creatinine down to 0.95. On LR @ 85 ml/kg -Intermediate RF for procedure, EKG: normal sinus rhythm. No noninvasive testing indicated at this time.  -Functional capacity at home involves walking multiple blocks w/o getting SOB w/ equivocate to around 4-10 METS  -No indication for preoperative BB at this time  #Hx of Factor 5 Leiden deficiency  -Pt reports hx of this, will monitor anticoagulation closely in patient  FEN/GI: NPO overnight for surgery PPx: Lovenox SQ 40 mg + SCD,   Disposition: Pending ORIF  Subjective: No problems over night. Still having pain.  Objective: Temp:  [97.8 F (36.6 C)-98.6 F (37 C)] 98.6 F (37 C) (07/22 0525) Pulse Rate:  [70-87] 70 (07/22 0525) Resp:  [12-19] 14 (07/22 0525) BP: (102-142)/(46-75) 142/69 mmHg (07/22 0525) SpO2:  [94 %-100 %] 97 % (07/22  0525)  Physical Exam: General: No acute distress, comfortable, sleepy Cardiovascular: RRR, no murmurs Respiratory: clear to auscultation bilaterally, no wheezes Abdomen: Soft, non-tender, non-distended Extremities: L lower leg in splint from mid femur distal, pulses intact B/L +2/4, sensation intact distally, able to wiggle toes Skin: no rashes  Neuro: no focal deficits, alert & oriented x3  Laboratory:  Recent Labs Lab 01/11/13 0909 01/11/13 0952 01/11/13 1427 01/12/13 0912  WBC 17.1*  --  10.8* 9.0  HGB 14.4 16.0* 13.3 11.7*  HCT 42.0 47.0* 38.9 33.9*  PLT 437*  --  403* 317    Recent Labs Lab 01/11/13 0952 01/11/13 1427 01/12/13 0635  NA 137  --  133*  K 3.7  --  3.7  CL 100  --  96  CO2  --   --  25  BUN 16  --  19  CREATININE 1.30* 1.37* 0.95  CALCIUM  --   --  8.8  GLUCOSE 106*  --  119*    Jacquelin Hawking, MD 01/12/2013, 12:23 PM PGY-1, [facility] FPTS Intern pager: [phone], text pages welcome

## 2013-01-12 NOTE — Progress Notes (Signed)
Orthopedic Tech Progress Note Patient Details:  Morgan Kelly [birth year] [id number] Ordered by Dr. Janee Morn. Ortho Devices Type of Ortho Device: Ace wrap;Thumb spica splint Splint Material: Plaster Ortho Device/Splint Location: LUE Ortho Device/Splint Interventions: Ordered;Application   [name] 01/12/2013, 8:29 PM

## 2013-01-12 NOTE — Progress Notes (Signed)
UR COMPLETED  

## 2013-01-12 NOTE — Progress Notes (Signed)
Orthopedic Tech Progress Note Patient Details:  Morgan Kelly 12/18/1950 [id number]  Ortho Devices Type of Ortho Device: Thumb velcro splint Ortho Device/Splint Location: LLE Ortho Device/Splint Interventions: Application   Cammer, Mickie Bail 01/12/2013, 10:55 AM

## 2013-01-12 NOTE — Consult Note (Signed)
Family Medicine Teaching Service Attending Note  I interviewed and examined patient Morgan Kelly and reviewed their tests and x-rays.  I discussed with Dr. Paulina Fusi and reviewed their note for today.  I agree with their assessment and plan.     Additionally  Alert interactive this am complains of pain in leg and all over  Chronic medical problems seem stable  We appreciate the consult and will follow with you
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
## 2013-01-12 NOTE — Progress Notes (Signed)
Discussed in rounds.  Agree with Dr. Caleb Popp.  Seen today by attending, Dr. Deirdre Priest.

## 2013-01-12 NOTE — Progress Notes (Signed)
Dedicated left thumb xrays reveal dorsal dislocation of left thumb at IPJ, perhaps with small volar fx at base of distal phalanx.   Digital block performed, and gentle manipulative reduction performed.   Much improved active and passive flexion.  Appeared stable to neutral extension at least. FA-based TS splint applied, placing the IPJ into 30 degrees of flexion. Will order f/u xrays.

## 2013-01-12 NOTE — Progress Notes (Signed)
Ortho Trauma Service F/U  Plan for OR on thrusday, pt on schedule for 1045 Pt states there her pain is not that well controlled  Reviewed pts home meds Will increased Oxy IR to 10-20 mg po q 4 hours prn pain and add low dose dilaudid PCA. Will monitor  Pt can start regular diet  Reviewed Dr. Carollee Massed note.  Greatly appreciate his assistance   Mearl Latin, PA-C [facility] [phone] (P) 01/12/2013 4:22 PM
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
## 2013-01-12 NOTE — Progress Notes (Signed)
    Subjective:     Patient reports pain as 2 on 0-10 scale.   Denies CP or SOB.  Voiding without difficulty. Positive flatus. Objective: Vital signs in last 24 hours: Temp:  [97.8 F (36.6 C)-98.6 F (37 C)] 98.6 F (37 C) (07/22 0525) Pulse Rate:  [70-87] 70 (07/22 0525) Resp:  [12-19] 14 (07/22 0525) BP: (98-142)/(46-75) 142/69 mmHg (07/22 0525) SpO2:  [94 %-100 %] 97 % (07/22 0525)  Intake/Output from previous day: 07/21 0701 - 07/22 0700 In: -  Out: 75 [Urine:75] Intake/Output this shift: Total I/O In: -  Out: 75 [Urine:75]  Labs:  Recent Labs  01/11/13 0909 01/11/13 0952 01/11/13 1427  HGB 14.4 16.0* 13.3    Recent Labs  01/11/13 0909 01/11/13 0952 01/11/13 1427  WBC 17.1*  --  10.8*  RBC 4.38  --  4.05  HCT 42.0 47.0* 38.9  PLT 437*  --  403*    Recent Labs  01/11/13 0952 01/11/13 1427  NA 137  --   K 3.7  --   CL 100  --   BUN 16  --   CREATININE 1.30* 1.37*  GLUCOSE 106*  --     Recent Labs  01/11/13 0909  INR 1.05    Physical Exam: Neurologically intact ABD soft Compartment soft  Assessment/Plan: CT scan completed Await further recommendations from Dr Carola Frost Continue NPO and IV fluids for now.  Venita Lick D for Dr. Venita Lick [facility] Orthopa[facility]ics [phone] 01/12/2013, 6:02 AM

## 2013-01-13 ENCOUNTER — Inpatient Hospital Stay (HOSPITAL_COMMUNITY): Payer: Medicare Other

## 2013-01-13 DIAGNOSIS — R7989 Other specified abnormal findings of blood chemistry: Secondary | ICD-10-CM

## 2013-01-13 LAB — BASIC METABOLIC PANEL
CO2: 27 mEq/L (ref 19–32)
Calcium: 8.6 mg/dL (ref 8.4–10.5)
Creatinine, Ser: 0.78 mg/dL (ref 0.50–1.10)
Glucose, Bld: 109 mg/dL — ABNORMAL HIGH (ref 70–99)

## 2013-01-13 MED ORDER — LORATADINE 10 MG PO TABS
10.0000 mg | ORAL_TABLET | Freq: Every day | ORAL | Status: DC
  Administered 2013-01-15 – 2013-01-18 (×4): 10 mg via ORAL
  Filled 2013-01-13 (×6): qty 1

## 2013-01-13 MED ORDER — ACETAMINOPHEN 500 MG PO TABS
500.0000 mg | ORAL_TABLET | Freq: Four times a day (QID) | ORAL | Status: DC | PRN
  Administered 2013-01-14: 1000 mg via ORAL
  Filled 2013-01-13: qty 2

## 2013-01-13 MED ORDER — CEFAZOLIN SODIUM-DEXTROSE 2-3 GM-% IV SOLR
2.0000 g | Freq: Once | INTRAVENOUS | Status: DC
  Filled 2013-01-13: qty 50

## 2013-01-13 NOTE — Progress Notes (Signed)
Family Medicine Teaching Service Daily Progress Note Intern Pager: [phone]  Patient name: Morgan Kelly Medical record number: [id number] Date of birth: 25-Nov-1950 Age: 62 y.o. Gender: female  Primary Care Provider: Tonye Pearson, MD Code Status: Full Code  Pt Overview and Major Events to Date:   Assessment and Plan: Morgan Kelly is a 62 y.o. year old female presenting with supracondylar fx with   #Supracondylar Fx, L leg - Managed by Orthopaedics, will continue to follow along - Pain per primary team -f/u OR Thursday - PT/OT per primary   # Hx of Asthma  -Continue home medications  - singulair  - claritin  - dulera  - proventil  #Hx of GAD/Depression - Continue home Xanax, Celexa, Elavil   #Hx of Hypothyroidism? -Pt on synthroid 200 mcg in outpatient setting -No formal dx of this, but will continue while inpatient   #Pre-operative assessment/AKI on admission (currently resolved). -No hx of MI/CAD/CHF/arrhythmia/AVB/Vavular dx, h/o CAD/CHF/DM.  However, does smoke 1 ppd for last 14 yrs.  Will tx w/ nicotine patch -Elevated creatinine on admission to 1.30. Last creatinine down to 0.78. On LR @ 85 ml/kg -Intermediate RF for procedure, EKG: normal sinus rhythm. No noninvasive testing indicated at this time.  -Functional capacity at home involves walking multiple blocks w/o getting SOB w/ equivocate to around 4-10 METS  -No indication for preoperative BB at this time  #Hx of Factor 5 Leiden deficiency  -Pt reports hx of this, will monitor anticoagulation closely in patient  FEN/GI: NPO overnight tonight for surgery PPx: Lovenox SQ 40 mg + SCD,   Disposition: Pending ORIF on Thursday  Subjective: No problems over night. Still having pain.  Objective: Temp:  [97.7 F (36.5 C)-98.5 F (36.9 C)] 98.3 F (36.8 C) (07/23 1315) Pulse Rate:  [71-76] 76 (07/23 1315) Resp:  [11-18] 16 (07/23 1315) BP: (113-136)/(50-61) 113/50 mmHg (07/23 1315) SpO2:  [96  %-100 %] 99 % (07/23 1315)  Physical Exam: General: No acute distress, comfortable, sleepy Cardiovascular: RRR, no murmurs Respiratory: clear to auscultation bilaterally, no wheezes Abdomen: Soft, non-tender, non-distended Extremities: L lower leg in splint from mid femur distal, pulses intact B/L +2/4, sensation intact distally, able to wiggle toes; left wrist and hand in splint Skin: no rashes  Neuro: no focal deficits, alert & oriented x3  Laboratory:  Recent Labs Lab 01/11/13 0909 01/11/13 0952 01/11/13 1427 01/12/13 0912  WBC 17.1*  --  10.8* 9.0  HGB 14.4 16.0* 13.3 11.7*  HCT 42.0 47.0* 38.9 33.9*  PLT 437*  --  403* 317    Recent Labs Lab 01/11/13 0952 01/11/13 1427 01/12/13 0635 01/13/13 0435  NA 137  --  133* 135  K 3.7  --  3.7 3.6  CL 100  --  96 98  CO2  --   --  25 27  BUN 16  --  19 9  CREATININE 1.30* 1.37* 0.95 0.78  CALCIUM  --   --  8.8 8.6  GLUCOSE 106*  --  119* 109*    Jacquelin Hawking, MD 01/13/2013, 2:33 PM PGY-1, [facility] FPTS Intern pager: [phone], text pages welcome
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
## 2013-01-13 NOTE — Progress Notes (Signed)
Hand Surgery  Post-reduction xrays done last night are sub-optimal, but seem to reveal reduction of the IPJ.  I have asked for new xrays (a good lateral especially) to be done today to complement those films to ensure good concentric reduction of joint. Patient to keep splint on, f/u with me in 2-3 weeks.  Neil Crouch, MD

## 2013-01-13 NOTE — Progress Notes (Signed)
Seen and examined.  Agree with Dr. Caleb Popp.  Medically stable.  For ortho surg tomorrow.

## 2013-01-14 ENCOUNTER — Encounter (HOSPITAL_COMMUNITY): Payer: Self-pay | Admitting: Anesthesiology

## 2013-01-14 ENCOUNTER — Encounter (HOSPITAL_COMMUNITY)
Admission: EM | Disposition: A | Discharge status: Home Health | Payer: Self-pay | Source: Home / Self Care | Attending: Orthopedic Surgery

## 2013-01-14 ENCOUNTER — Inpatient Hospital Stay (HOSPITAL_COMMUNITY): Payer: Medicare Other

## 2013-01-14 ENCOUNTER — Inpatient Hospital Stay (HOSPITAL_COMMUNITY): Payer: Medicare Other | Admitting: Anesthesiology

## 2013-01-14 HISTORY — PX: ORIF FEMUR FRACTURE: SHX2119

## 2013-01-14 LAB — BASIC METABOLIC PANEL
BUN: 4 mg/dL — ABNORMAL LOW (ref 6–23)
Calcium: 8.5 mg/dL (ref 8.4–10.5)
GFR calc non Af Amer: >90 mL/min (ref >90)
Glucose, Bld: 110 mg/dL — ABNORMAL HIGH (ref 70–99)
Potassium: 3.4 mEq/L — ABNORMAL LOW (ref 3.5–5.1)

## 2013-01-14 LAB — CBC
HCT: 28.7 % — ABNORMAL LOW (ref 36.0–46.0)
Hemoglobin: 9.7 g/dL — ABNORMAL LOW (ref 12.0–15.0)
MCH: 32.8 pg (ref 26.0–34.0)
MCHC: 33.8 g/dL (ref 30.0–36.0)
MCHC: 34 g/dL (ref 30.0–36.0)
MCV: 96.6 fL (ref 78.0–100.0)
Platelets: 193 10*3/uL (ref 150–400)

## 2013-01-14 LAB — CREATININE, SERUM: Creatinine, Ser: 0.62 mg/dL (ref 0.50–1.10)

## 2013-01-14 LAB — ABO/RH: ABO/RH(D): A POS

## 2013-01-14 LAB — TYPE AND SCREEN

## 2013-01-14 SURGERY — OPEN REDUCTION INTERNAL FIXATION (ORIF) DISTAL FEMUR FRACTURE
Anesthesia: General | Site: Leg Upper | Laterality: Left | Wound class: Clean

## 2013-01-14 MED ORDER — ENOXAPARIN SODIUM 40 MG/0.4ML ~~LOC~~ SOLN
40.0000 mg | SUBCUTANEOUS | Status: DC
  Filled 2013-01-14: qty 0.4

## 2013-01-14 MED ORDER — MIDAZOLAM HCL 2 MG/2ML IJ SOLN
1.0000 mg | INTRAMUSCULAR | Status: DC | PRN
  Administered 2013-01-14: 1 mg via INTRAVENOUS

## 2013-01-14 MED ORDER — FENTANYL CITRATE 0.05 MG/ML IJ SOLN: 50.0000 ug | INTRAMUSCULAR | Status: DC | PRN

## 2013-01-14 MED ORDER — DOCUSATE SODIUM 100 MG PO CAPS
100.0000 mg | ORAL_CAPSULE | Freq: Two times a day (BID) | ORAL | Status: DC
  Administered 2013-01-14 – 2013-01-18 (×8): 100 mg via ORAL
  Filled 2013-01-14 (×9): qty 1

## 2013-01-14 MED ORDER — ONDANSETRON HCL 4 MG/2ML IJ SOLN
INTRAMUSCULAR | Status: DC | PRN
  Administered 2013-01-14: 4 mg via INTRAVENOUS

## 2013-01-14 MED ORDER — SUFENTANIL CITRATE 50 MCG/ML IV SOLN
INTRAVENOUS | Status: DC | PRN
  Administered 2013-01-14: 5 ug via INTRAVENOUS
  Administered 2013-01-14: 10 ug via INTRAVENOUS
  Administered 2013-01-14 (×2): 5 ug via INTRAVENOUS
  Administered 2013-01-14 (×2): 10 ug via INTRAVENOUS

## 2013-01-14 MED ORDER — ALBUMIN HUMAN 5 % IV SOLN
INTRAVENOUS | Status: DC | PRN
  Administered 2013-01-14: 13:00:00 via INTRAVENOUS

## 2013-01-14 MED ORDER — ONDANSETRON HCL 4 MG PO TABS: 4.0000 mg | ORAL_TABLET | Freq: Four times a day (QID) | ORAL | Status: DC | PRN

## 2013-01-14 MED ORDER — GLYCOPYRROLATE 0.2 MG/ML IJ SOLN
INTRAMUSCULAR | Status: DC | PRN
  Administered 2013-01-14: 0.4 mg via INTRAVENOUS

## 2013-01-14 MED ORDER — BUPIVACAINE-EPINEPHRINE PF 0.5-1:200000 % IJ SOLN
INTRAMUSCULAR | Status: DC | PRN
  Administered 2013-01-14: 30 mL

## 2013-01-14 MED ORDER — PROPOFOL 10 MG/ML IV BOLUS
INTRAVENOUS | Status: DC | PRN
  Administered 2013-01-14: 40 mg via INTRAVENOUS
  Administered 2013-01-14: 160 mg via INTRAVENOUS

## 2013-01-14 MED ORDER — METOCLOPRAMIDE HCL 5 MG/ML IJ SOLN: 5.0000 mg | Freq: Three times a day (TID) | INTRAMUSCULAR | Status: DC | PRN

## 2013-01-14 MED ORDER — ENOXAPARIN SODIUM 40 MG/0.4ML ~~LOC~~ SOLN
40.0000 mg | Freq: Every day | SUBCUTANEOUS | Status: DC
  Administered 2013-01-15 – 2013-01-18 (×4): 40 mg via SUBCUTANEOUS
  Filled 2013-01-14 (×4): qty 0.4

## 2013-01-14 MED ORDER — FLUCONAZOLE 150 MG PO TABS
150.0000 mg | ORAL_TABLET | Freq: Once | ORAL | Status: AC
  Administered 2013-01-14: 150 mg via ORAL
  Filled 2013-01-14: qty 1

## 2013-01-14 MED ORDER — CEFAZOLIN SODIUM-DEXTROSE 2-3 GM-% IV SOLR
INTRAVENOUS | Status: DC | PRN
  Administered 2013-01-14: 2 g via INTRAVENOUS

## 2013-01-14 MED ORDER — ONDANSETRON HCL 4 MG/2ML IJ SOLN: 4.0000 mg | Freq: Four times a day (QID) | INTRAMUSCULAR | Status: DC | PRN

## 2013-01-14 MED ORDER — POLYETHYLENE GLYCOL 3350 17 G PO PACK
17.0000 g | PACK | Freq: Every day | ORAL | Status: DC
  Administered 2013-01-15 – 2013-01-18 (×4): 17 g via ORAL
  Filled 2013-01-14 (×4): qty 1

## 2013-01-14 MED ORDER — POTASSIUM CHLORIDE IN NACL 20-0.9 MEQ/L-% IV SOLN
INTRAVENOUS | Status: DC
  Filled 2013-01-14 (×5): qty 1000

## 2013-01-14 MED ORDER — LACTATED RINGERS IV SOLN
INTRAVENOUS | Status: DC | PRN
  Administered 2013-01-14 (×2): via INTRAVENOUS

## 2013-01-14 MED ORDER — CEFAZOLIN SODIUM 1-5 GM-% IV SOLN
1.0000 g | Freq: Four times a day (QID) | INTRAVENOUS | Status: AC
  Administered 2013-01-14 – 2013-01-15 (×3): 1 g via INTRAVENOUS
  Filled 2013-01-14 (×3): qty 50

## 2013-01-14 MED ORDER — MIDAZOLAM HCL 2 MG/2ML IJ SOLN
INTRAMUSCULAR | Status: AC
  Administered 2013-01-14: 2 mg
  Filled 2013-01-14: qty 4

## 2013-01-14 MED ORDER — FERROUS SULFATE 325 (65 FE) MG PO TABS
325.0000 mg | ORAL_TABLET | Freq: Three times a day (TID) | ORAL | Status: DC
  Administered 2013-01-14 – 2013-01-18 (×12): 325 mg via ORAL
  Filled 2013-01-14 (×14): qty 1

## 2013-01-14 MED ORDER — METOCLOPRAMIDE HCL 10 MG PO TABS: 5.0000 mg | ORAL_TABLET | Freq: Three times a day (TID) | ORAL | Status: DC | PRN

## 2013-01-14 MED ORDER — FENTANYL CITRATE 0.05 MG/ML IJ SOLN
INTRAMUSCULAR | Status: AC
  Filled 2013-01-14: qty 2

## 2013-01-14 MED ORDER — HYDROMORPHONE 0.3 MG/ML IV SOLN
INTRAVENOUS | Status: AC
  Filled 2013-01-14: qty 25

## 2013-01-14 MED ORDER — PHENYLEPHRINE HCL 10 MG/ML IJ SOLN
10.0000 mg | INTRAVENOUS | Status: DC | PRN
  Administered 2013-01-14: 20 ug/min via INTRAVENOUS

## 2013-01-14 MED ORDER — ROCURONIUM BROMIDE 100 MG/10ML IV SOLN
INTRAVENOUS | Status: DC | PRN
  Administered 2013-01-14: 10 mg via INTRAVENOUS
  Administered 2013-01-14: 50 mg via INTRAVENOUS

## 2013-01-14 MED ORDER — FENTANYL CITRATE 0.05 MG/ML IJ SOLN
INTRAMUSCULAR | Status: AC
  Administered 2013-01-14: 50 ug
  Filled 2013-01-14: qty 4

## 2013-01-14 MED ORDER — FENTANYL CITRATE 0.05 MG/ML IJ SOLN
50.0000 ug | INTRAMUSCULAR | Status: DC | PRN
  Administered 2013-01-14: 100 ug via INTRAVENOUS

## 2013-01-14 MED ORDER — NEOSTIGMINE METHYLSULFATE 1 MG/ML IJ SOLN
INTRAMUSCULAR | Status: DC | PRN
  Administered 2013-01-14: 3 mg via INTRAVENOUS

## 2013-01-14 MED ORDER — FENTANYL CITRATE 0.05 MG/ML IJ SOLN
25.0000 ug | INTRAMUSCULAR | Status: DC | PRN
  Administered 2013-01-14 (×2): 50 ug via INTRAVENOUS

## 2013-01-14 MED ORDER — FLUCONAZOLE 150 MG PO TABS
150.0000 mg | ORAL_TABLET | Freq: Every day | ORAL | Status: DC
  Filled 2013-01-14: qty 1

## 2013-01-14 SURGICAL SUPPLY — 68 items
ANCH SUT 6.5 KNTLS PEEK (Screw) ×2 IMPLANT
BANDAGE ELASTIC 4 VELCRO ST LF (GAUZE/BANDAGES/DRESSINGS) ×1 IMPLANT
BANDAGE ELASTIC 6 VELCRO ST LF (GAUZE/BANDAGES/DRESSINGS) ×1 IMPLANT
BANDAGE GAUZE ELAST BULKY 4 IN (GAUZE/BANDAGES/DRESSINGS) ×1 IMPLANT
BIT DRILL CALIBRATED 3.2MM (DRILL) IMPLANT
BIT DRILL GUIDEWIRE 2.5X200 (WIRE) ×4 IMPLANT
BLADE SURG ROTATE 9660 (MISCELLANEOUS) IMPLANT
BRUSH SCRUB DISP (MISCELLANEOUS) ×5 IMPLANT
CLOTH BEACON ORANGE TIMEOUT ST (SAFETY) ×2 IMPLANT
DRAPE C-ARM 42X72 X-RAY (DRAPES) ×2 IMPLANT
DRAPE C-ARMOR (DRAPES) ×2 IMPLANT
DRAPE ORTHO SPLIT 77X108 STRL (DRAPES) ×4
DRAPE SURG ORHT 6 SPLT 77X108 (DRAPES) ×3 IMPLANT
DRAPE U-SHAPE 47X51 STRL (DRAPES) ×2 IMPLANT
DRILL CALIBRATED 3.2MM (DRILL) ×2
DRSG ADAPTIC 3X8 NADH LF (GAUZE/BANDAGES/DRESSINGS) ×2 IMPLANT
DRSG PAD ABDOMINAL 8X10 ST (GAUZE/BANDAGES/DRESSINGS) ×6 IMPLANT
ELECT REM PT RETURN 9FT ADLT (ELECTROSURGICAL) ×2
ELECTRODE REM PT RTRN 9FT ADLT (ELECTROSURGICAL) ×1 IMPLANT
EVACUATOR 1/8 PVC DRAIN (DRAIN) IMPLANT
EVACUATOR 3/16  PVC DRAIN (DRAIN)
EVACUATOR 3/16 PVC DRAIN (DRAIN) IMPLANT
GLOVE BIO SURGEON STRL SZ7.5 (GLOVE) ×2 IMPLANT
GLOVE BIO SURGEON STRL SZ8 (GLOVE) ×2 IMPLANT
GLOVE BIOGEL PI IND STRL 7.5 (GLOVE) ×1 IMPLANT
GLOVE BIOGEL PI IND STRL 8 (GLOVE) ×1 IMPLANT
GLOVE BIOGEL PI INDICATOR 7.5 (GLOVE) ×1
GLOVE BIOGEL PI INDICATOR 8 (GLOVE) ×1
GOWN PREVENTION PLUS XLARGE (GOWN DISPOSABLE) ×5 IMPLANT
GOWN STRL NON-REIN LRG LVL3 (GOWN DISPOSABLE) ×3 IMPLANT
IMMOBILIZER KNEE 22 UNIV (SOFTGOODS) ×1 IMPLANT
KIT BASIN OR (CUSTOM PROCEDURE TRAY) ×2 IMPLANT
KIT ROOM TURNOVER OR (KITS) ×2 IMPLANT
MANIFOLD NEPTUNE II (INSTRUMENTS) IMPLANT
NEEDLE 22X1 1/2 (OR ONLY) (NEEDLE) IMPLANT
NS IRRIG 1000ML POUR BTL (IV SOLUTION) ×2 IMPLANT
PACK TOTAL JOINT (CUSTOM PROCEDURE TRAY) ×2 IMPLANT
PAD ARMBOARD 7.5X6 YLW CONV (MISCELLANEOUS) ×4 IMPLANT
PAD CAST 4YDX4 CTTN HI CHSV (CAST SUPPLIES) ×1 IMPLANT
PADDING CAST COTTON 4X4 STRL (CAST SUPPLIES) ×2
PADDING CAST COTTON 6X4 STRL (CAST SUPPLIES) ×2 IMPLANT
PLATE CONDYLAR LCP 6H LEFT (Plate) ×1 IMPLANT
SCREW 7.3 CANN LK 70 (Screw) ×2 IMPLANT
SCREW CANN LOCK 5.0X65 (Screw) ×4 IMPLANT
SCREW CANN LOCK 5.0X70 (Screw) ×2 IMPLANT
SCREW CONICAL 4.0MM 34MM (Screw) ×2 IMPLANT
SCREW CORTEX ST 4.5X26 (Screw) ×1 IMPLANT
SCREW CORTEX ST 4.5X30 (Screw) ×2 IMPLANT
SCREW CORTEX ST 4.5X42 (Screw) ×1 IMPLANT
SCREW LOCKING 5.0MM 25MM (Screw) ×1 IMPLANT
SCREW LOCKING 55 (Screw) ×1 IMPLANT
SPONGE GAUZE 4X4 12PLY (GAUZE/BANDAGES/DRESSINGS) ×2 IMPLANT
SPONGE LAP 18X18 X RAY DECT (DISPOSABLE) ×2 IMPLANT
STAPLER VISISTAT 35W (STAPLE) ×2 IMPLANT
SUCTION FRAZIER TIP 10 FR DISP (SUCTIONS) ×2 IMPLANT
SUT ETHILON 3 0 PS 1 (SUTURE) ×4 IMPLANT
SUT PROLENE 0 CT 2 (SUTURE) IMPLANT
SUT VIC AB 0 CT1 27 (SUTURE)
SUT VIC AB 0 CT1 27XBRD ANBCTR (SUTURE) ×2 IMPLANT
SUT VIC AB 1 CT1 27 (SUTURE) ×4
SUT VIC AB 1 CT1 27XBRD ANBCTR (SUTURE) ×2 IMPLANT
SUT VIC AB 2-0 CT1 27 (SUTURE) ×2
SUT VIC AB 2-0 CT1 TAPERPNT 27 (SUTURE) ×1 IMPLANT
SYR 20ML ECCENTRIC (SYRINGE) IMPLANT
TOWEL OR 17X24 6PK STRL BLUE (TOWEL DISPOSABLE) ×2 IMPLANT
TOWEL OR 17X26 10 PK STRL BLUE (TOWEL DISPOSABLE) ×4 IMPLANT
TRAY FOLEY CATH 16FRSI W/METER (SET/KITS/TRAYS/PACK) IMPLANT
WATER STERILE IRR 1000ML POUR (IV SOLUTION) ×2 IMPLANT

## 2013-01-14 NOTE — Progress Notes (Signed)
MD, patient stated that prior to admission she believe that she had and still has a yeast infection.  She would like confirmation of this and to be treated while being here in the hospital.

## 2013-01-14 NOTE — Progress Notes (Signed)
  New xrays of left thumb indicate good concentric reduction of IPJ disloction. Keep splint on, clean and dry. F/u with me in approx 2 weeks.  Darinda [place] A. 01/14/2013, 8:33 AM

## 2013-01-14 NOTE — Anesthesia Postprocedure Evaluation (Signed)
Anesthesia Post Note  Patient: Morgan Kelly  Procedure(s) Performed: Procedure(s) (LRB): OPEN REDUCTION INTERNAL FIXATION (ORIF) DISTAL FEMUR FRACTURE (Left)  Anesthesia type: General  Patient location: PACU  Post pain: Pain level controlled and Adequate analgesia  Post assessment: Post-op Vital signs reviewed, Patient's Cardiovascular Status Stable, Respiratory Function Stable, Patent Airway and Pain level controlled  Last Vitals:  Filed Vitals:   01/14/13 1600  BP:   Pulse: 79  Temp:   Resp: 14    Post vital signs: Reviewed and stable  Level of consciousness: awake, alert  and oriented  Complications: No apparent anesthesia complications

## 2013-01-14 NOTE — Consult Note (Signed)
Given the complexity of the fracture pattern, Dr. Shon Baton asserted this was outside his scope of practice and that it would be in the best interest of the patient to have these injuries evaluated and treated by a fellowship trained orthopaedic traumatologist. Have spoken at length with Morgan Kelly, I agree with his evaluation and assessment, and have generated treatment plan for femoral repair.  Also spoke with Dr. Shon Baton, from whom we will assume care.   Myrene Galas, MD [facility] [phone] (p)

## 2013-01-14 NOTE — Transfer of Care (Signed)
Immediate Anesthesia Transfer of Care Note  Patient: Morgan Kelly  Procedure(s) Performed: Procedure(s): OPEN REDUCTION INTERNAL FIXATION (ORIF) DISTAL FEMUR FRACTURE (Left)  Patient Location: PACU  Anesthesia Type:General  Level of Consciousness: awake, alert  and oriented  Airway & Oxygen Therapy: Patient Spontanous Breathing and Patient connected to face mask oxygen  Post-op Assessment: Report given to PACU RN, Post -op Vital signs reviewed and stable and Patient moving all extremities  Post vital signs: Reviewed and stable  Complications: No apparent anesthesia complications
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
## 2013-01-14 NOTE — Progress Notes (Signed)
10:30  Floor Staff Rn came to Rm 36 and picked up both containers with patient's contacts.  DA

## 2013-01-14 NOTE — Progress Notes (Signed)
I have seen and examined the patient. I agree with the findings above.  I discussed with the patient the risks and benefits of surgery for her left distal femur fracture, including the possibility of infection, nerve injury, vessel injury, wound breakdown, arthritis, symptomatic hardware, DVT/ PE, loss of motion, and need for further surgery among others.  She understood these risks and wished to proceed.  Budd Palmer, MD 01/14/2013 12:07 PM
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
## 2013-01-14 NOTE — Anesthesia Procedure Notes (Addendum)
Anesthesia Regional Block:  Femoral nerve block  Pre-Anesthetic Checklist: ,, timeout performed, Correct Patient, Correct Site, Correct Laterality, Correct Procedure,, site marked, risks and benefits discussed, Surgical consent,  Pre-op evaluation,  At surgeon's request and post-op pain management  Laterality: Left  Prep: chloraprep       Needles:  Injection technique: Single-shot  Needle Type: Echogenic Stimulator Needle     Needle Length: 9cm  Needle Gauge: 21    Additional Needles:  Procedures: nerve stimulator Femoral nerve block  Nerve Stimulator or Paresthesia:  Response: Quadriceps muscle contraction, 0.45 mA,   Additional Responses:   Narrative:  Start time: 01/14/2013 11:22 AM End time: 01/14/2013 11:31 AM Injection made incrementally with aspirations every 5 mL.  Performed by: Personally  Anesthesiologist: Dr Chaney Malling  Additional Notes: Functioning IV was confirmed and monitors were applied.  A 90mm 21ga Arrow echogenic stimulator needle was used. Sterile prep and drape,hand hygiene and sterile gloves were used.  Negative aspiration and negative test dose prior to incremental administration of local anesthetic. The patient tolerated the procedure well.    Femoral nerve block Procedure Name: Intubation Date/Time: 01/14/2013 12:19 PM Performed by: Coralee Rud Pre-anesthesia Checklist: Patient identified, Emergency Drugs available, Suction available, Patient being monitored and Timeout performed Patient Re-evaluated:Patient Re-evaluated prior to inductionOxygen Delivery Method: Circle system utilized Intubation Type: IV induction Ventilation: Mask ventilation without difficulty Laryngoscope Size: Miller and 3 Grade View: Grade II Tube type: Oral Tube size: 7.0 mm Number of attempts: 1 Airway Equipment and Method: Stylet Placement Confirmation: ETT inserted through vocal cords under direct vision,  breath sounds checked- equal and bilateral and positive  ETCO2 Secured at: 21 cm Tube secured with: Tape Dental Injury: Teeth and Oropharynx as per pre-operative assessment

## 2013-01-14 NOTE — Anesthesia Preprocedure Evaluation (Signed)
Anesthesia Evaluation  Patient identified by MRN, date of birth, ID band Patient awake    Reviewed: Allergy & Precautions, H&P , NPO status , Patient's Chart, lab work & pertinent test results  Airway Mallampati: II  Neck ROM: full    Dental   Pulmonary asthma , Current Smoker, PE         Cardiovascular + Peripheral Vascular Disease     Neuro/Psych Depression  Neuromuscular disease    GI/Hepatic GERD-  ,  Endo/Other  Hypothyroidism   Renal/GU      Musculoskeletal  (+) Fibromyalgia -  Abdominal   Peds  Hematology Factor V leiden   Anesthesia Other Findings   Reproductive/Obstetrics                           Anesthesia Physical Anesthesia Plan  ASA: III  Anesthesia Plan: General and Regional   Post-op Pain Management: MAC Combined w/ Regional for Post-op pain   Induction: Intravenous  Airway Management Planned: Oral ETT  Additional Equipment:   Intra-op Plan:   Post-operative Plan: Extubation in OR  Informed Consent: I have reviewed the patients History and Physical, chart, labs and discussed the procedure including the risks, benefits and alternatives for the proposed anesthesia with the patient or authorized representative who has indicated his/her understanding and acceptance.     Plan Discussed with: CRNA, Anesthesiologist and Surgeon  Anesthesia Plan Comments:         Anesthesia Quick Evaluation

## 2013-01-14 NOTE — Brief Op Note (Signed)
01/11/2013 - 01/14/2013  2:45 PM  PATIENT:  Morgan Kelly  62 y.o. female  PRE-OPERATIVE DIAGNOSIS:  Left Distal Femur Fx  POST-OPERATIVE DIAGNOSIS:  Left Distal Femur Fx  PROCEDURE:  Procedure(s): OPEN REDUCTION INTERNAL FIXATION (ORIF) DISTAL FEMUR FRACTURE (Left)  SURGEON:  Surgeon(s) and Role:    * Budd Palmer, MD - Primary  PHYSICIAN ASSISTANT: Montez Morita, [facility]  ANESTHESIA:   general  EBL:  Total I/O In: 1250 [I.V.:1000; IV Piggyback:250] Out: 1650 [Urine:1550; Blood:100]  BLOOD ADMINISTERED:none  DRAINS: none   LOCAL MEDICATIONS USED:  NONE  SPECIMEN:  No Specimen  DISPOSITION OF SPECIMEN:  N/A  COUNTS:  YES  TOURNIQUET:  * No tourniquets in log *  DICTATION: .Other Dictation: Dictation Number [id number]  PLAN OF CARE: Admit to inpatient   PATIENT DISPOSITION:  PACU - hemodynamically stable.   Delay start of Pharmacological VTE agent (>24hrs) due to surgical blood loss or risk of bleeding: no

## 2013-01-14 NOTE — Progress Notes (Signed)
PT CANCELLATION NOTE  Pt. In OR so PT cancelled.  Please reorder PT when this is appropriate.  Thank you!  Weldon Picking PT Acute Rehab Services [phone] Beeper [phone]

## 2013-01-14 NOTE — Progress Notes (Signed)
Pt expressed concerns with receiving steroids during surgery at 0935. Pt stated that she felt like she had had an allergic reaction to them in the past. Called PACU to relay message to RN getting patient. Made note in allergy section of chart.

## 2013-01-15 ENCOUNTER — Encounter (HOSPITAL_COMMUNITY): Payer: Self-pay | Admitting: Orthopedic Surgery

## 2013-01-15 DIAGNOSIS — S63105A Unspecified dislocation of left thumb, initial encounter: Secondary | ICD-10-CM

## 2013-01-15 DIAGNOSIS — S72409A Unspecified fracture of lower end of unspecified femur, initial encounter for closed fracture: Secondary | ICD-10-CM

## 2013-01-15 DIAGNOSIS — IMO0002 Reserved for concepts with insufficient information to code with codable children: Secondary | ICD-10-CM

## 2013-01-15 LAB — CBC
Hemoglobin: 8.9 g/dL — ABNORMAL LOW (ref 12.0–15.0)
MCH: 33.2 pg (ref 26.0–34.0)
MCHC: 34.6 g/dL (ref 30.0–36.0)
MCV: 95.9 fL (ref 78.0–100.0)
Platelets: 197 10*3/uL (ref 150–400)
RBC: 2.68 MIL/uL — ABNORMAL LOW (ref 3.87–5.11)

## 2013-01-15 LAB — BASIC METABOLIC PANEL
BUN: 3 mg/dL — ABNORMAL LOW (ref 6–23)
CO2: 27 mEq/L (ref 19–32)
Calcium: 8.3 mg/dL — ABNORMAL LOW (ref 8.4–10.5)
GFR calc non Af Amer: >90 mL/min (ref >90)
Glucose, Bld: 108 mg/dL — ABNORMAL HIGH (ref 70–99)
Sodium: 132 mEq/L — ABNORMAL LOW (ref 135–145)

## 2013-01-15 MED ORDER — WARFARIN SODIUM 5 MG PO TABS
5.0000 mg | ORAL_TABLET | Freq: Once | ORAL | Status: AC
  Administered 2013-01-15: 5 mg via ORAL
  Filled 2013-01-15: qty 1

## 2013-01-15 MED ORDER — WARFARIN - PHARMACIST DOSING INPATIENT: Freq: Every day | Status: DC

## 2013-01-15 MED ORDER — WARFARIN VIDEO: Freq: Once | Status: DC

## 2013-01-15 MED ORDER — FLEET ENEMA 7-19 GM/118ML RE ENEM: 1.0000 | ENEMA | Freq: Every day | RECTAL | Status: DC | PRN

## 2013-01-15 MED ORDER — POTASSIUM CHLORIDE CRYS ER 20 MEQ PO TBCR
40.0000 meq | EXTENDED_RELEASE_TABLET | Freq: Once | ORAL | Status: AC
  Administered 2013-01-15: 40 meq via ORAL
  Filled 2013-01-15: qty 2

## 2013-01-15 MED ORDER — COUMADIN BOOK
Freq: Once | Status: AC
  Administered 2013-01-15: 18:00:00
  Filled 2013-01-15: qty 1

## 2013-01-15 MED ORDER — ACETAMINOPHEN 500 MG PO TABS
1000.0000 mg | ORAL_TABLET | Freq: Four times a day (QID) | ORAL | Status: DC
  Administered 2013-01-15 – 2013-01-18 (×11): 1000 mg via ORAL
  Filled 2013-01-15 (×17): qty 2

## 2013-01-15 MED ORDER — OXYCODONE HCL 5 MG PO TABS
30.0000 mg | ORAL_TABLET | Freq: Two times a day (BID) | ORAL | Status: DC | PRN
  Administered 2013-01-16: 30 mg via ORAL
  Filled 2013-01-15: qty 6

## 2013-01-15 MED ORDER — HYDROMORPHONE HCL PF 1 MG/ML IJ SOLN
0.5000 mg | INTRAMUSCULAR | Status: DC | PRN
  Administered 2013-01-16: 0.5 mg via INTRAVENOUS
  Filled 2013-01-15: qty 1

## 2013-01-15 MED ORDER — MAGNESIUM HYDROXIDE 400 MG/5ML PO SUSP
30.0000 mL | Freq: Every day | ORAL | Status: DC | PRN
  Administered 2013-01-15: 30 mL via ORAL
  Filled 2013-01-15: qty 30

## 2013-01-15 MED ORDER — METHOCARBAMOL 500 MG PO TABS
1000.0000 mg | ORAL_TABLET | Freq: Four times a day (QID) | ORAL | Status: DC
  Administered 2013-01-15 – 2013-01-16 (×5): 1000 mg via ORAL
  Administered 2013-01-16: 500 mg via ORAL
  Administered 2013-01-17 (×4): 1000 mg via ORAL
  Administered 2013-01-18: 500 mg via ORAL
  Administered 2013-01-18: 1000 mg via ORAL
  Filled 2013-01-15 (×16): qty 2
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
## 2013-01-15 NOTE — Progress Notes (Signed)
Family Medicine Teaching Service Daily Progress Note Intern Pager: [phone]  Patient name: Morgan Kelly Medical record number: [id number] Date of birth: 12/06/1950 Age: 62 y.o. Gender: female  Primary Care Provider: Tonye Pearson, MD Code Status: Full Code  Pt Overview and Major Events to Date:   Assessment and Plan: Morgan Kelly is a 62 y.o. year old female presenting with supracondylar fx. S/p ORIF post op day #1.  #Supracondylar Fx, L leg - Managed by Orthopaedics, will continue to follow along - Pain per primary team - PT/OT per primary  # Hyponatremia - currently 132 down from 135. Currently asymptomatic but will monitor [ ]  f/u Bmet  # Hypernatremia - currently 3.3 trending down. Will repleat - K and monitor potassium [ ]  f/u Bmet  # VTE prophylaxis with hx of previous PE and factor 5 lieden disorder - per ortho, lovenox bridge to coumadin; coumadin x8 weeks; SCDs currently  # Hx of Asthma  -Continue home medications  - singulair  - claritin  - dulera  - proventil  #Hx of GAD/Depression - Continue home Xanax, Celexa, Elavil   #Hx of Hypothyroidism? -Pt on synthroid 200 mcg in outpatient setting -No formal dx of this, but will continue while inpatient   #Pre-operative assessment/AKI on admission (currently resolved). -No hx of MI/CAD/CHF/arrhythmia/AVB/Vavular dx, h/o CAD/CHF/DM.  However, does smoke 1 ppd for last 14 yrs.  Will tx w/ nicotine patch -Elevated creatinine on admission to 1.30. Last creatinine down to 0.65. On LR @ 85 ml/kg -Intermediate RF for procedure, EKG: normal sinus rhythm. No noninvasive testing indicated at this time.  -Functional capacity at home involves walking multiple blocks w/o getting SOB w/ equivocate to around 4-10 METS  -No indication for preoperative BB at this time  #Hx of Factor 5 Leiden deficiency  -Pt reports hx of this, will monitor anticoagulation closely in patient  #yeast infection - given  fluconazole 150mg  once  FEN/GI: NPO overnight tonight for surgery PPx: Lovenox bridge to warfarin + SCD  Disposition: Pending ortho clearance  Subjective: Has leg pain, however, no other complaints. Advised patient to use incentive spirometer multiple times per hour and she confirmed understanding.  Objective: Temp:  [98.2 F (36.8 C)-99.6 F (37.6 C)] 99.1 F (37.3 C) (07/25 0625) Pulse Rate:  [70-94] 70 (07/25 0625) Resp:  [12-21] 18 (07/25 0800) BP: (104-133)/(46-65) 133/54 mmHg (07/25 0625) SpO2:  [92 %-100 %] 99 % (07/25 1021)  Physical Exam: General: No acute distress, comfortable, laying in bed, incentive spirometer by side table not being used Cardiovascular: RRR, no murmurs Respiratory: clear to auscultation bilaterally, no wheezes Abdomen: Soft, non-tender, non-distended, bowel sounds present Extremities: L lower leg in cast, sensation intact distally, able to wiggle toes; left wrist and hand in splint Skin: no rashes  Neuro: no focal deficits, alert & oriented x3  Laboratory:  Recent Labs Lab 01/14/13 0516 01/14/13 1707 01/15/13 0823  WBC 9.1 9.2 9.1  HGB 9.7* 8.8* 8.9*  HCT 28.7* 25.9* 25.7*  PLT 229 193 197    Recent Labs Lab 01/13/13 0435 01/14/13 0516 01/14/13 1707 01/15/13 0505  NA 135 135  --  132*  K 3.6 3.4*  --  3.3*  CL 98 100  --  95*  CO2 27 29  --  27  BUN 9 4*  --  3*  CREATININE 0.78 0.69 0.62 0.65  CALCIUM 8.6 8.5  --  8.3*  GLUCOSE 109* 110*  --  108*    Jacquelin Hawking,  MD 01/15/2013, 2:06 PM PGY-1, [facility] Family Medicine FPTS Intern pager: [phone], text pages welcome

## 2013-01-15 NOTE — Progress Notes (Signed)
PCA pump discontinued at 1830 today per MD/PA orders.   Continuous pulse ox placed in room at patient bedside so that RN staff can still monitor patients oxygen saturation and pulse.

## 2013-01-15 NOTE — Plan of Care (Signed)
Problem: Acute Rehab PT Goals Goal: Pt Will Ambulate Left platform attachment

## 2013-01-15 NOTE — Progress Notes (Signed)
Orthopedic Tech Progress Note Patient Details:  Morgan Kelly Jun 03, 1951 [id number] Spoke with patient in room and patient stated she feels she is moving well enough without OHF and doesn't think she needs it. No OHF applied.  Patient ID: Morgan Kelly, female   DOB: January 06, 1951, 62 y.o.   MRN: [id number]   Morgan Kelly 01/15/2013, 1:59 PM

## 2013-01-15 NOTE — Progress Notes (Signed)
Rehab Admissions Coordinator Note:  Patient was screened by Trish Mage for appropriateness for an Inpatient Acute Rehab Consult.  At this time, we are recommending Inpatient Rehab consult.  Trish Mage 01/15/2013, 12:19 PM  I can be reached at [phone].
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
## 2013-01-16 LAB — BASIC METABOLIC PANEL
CO2: 28 mEq/L (ref 19–32)
Calcium: 8.2 mg/dL — ABNORMAL LOW (ref 8.4–10.5)
Creatinine, Ser: 0.7 mg/dL (ref 0.50–1.10)
GFR calc non Af Amer: >90 mL/min (ref >90)
Glucose, Bld: 107 mg/dL — ABNORMAL HIGH (ref 70–99)
Sodium: 135 mEq/L (ref 135–145)

## 2013-01-16 LAB — CBC
MCH: 32 pg (ref 26.0–34.0)
MCV: 97.1 fL (ref 78.0–100.0)
Platelets: 219 10*3/uL (ref 150–400)
RBC: 2.72 MIL/uL — ABNORMAL LOW (ref 3.87–5.11)
RDW: 13.2 % (ref 11.5–15.5)

## 2013-01-16 LAB — PROTIME-INR: Prothrombin Time: 13.9 seconds (ref 11.6–15.2)

## 2013-01-16 MED ORDER — OXYCODONE HCL 5 MG PO TABS
30.0000 mg | ORAL_TABLET | Freq: Two times a day (BID) | ORAL | Status: DC
  Administered 2013-01-16 – 2013-01-18 (×4): 30 mg via ORAL
  Filled 2013-01-16 (×4): qty 6

## 2013-01-16 MED ORDER — OXYCODONE HCL 5 MG PO TABS
20.0000 mg | ORAL_TABLET | Freq: Three times a day (TID) | ORAL | Status: DC
  Administered 2013-01-16 – 2013-01-18 (×7): 20 mg via ORAL
  Filled 2013-01-16 (×6): qty 4

## 2013-01-16 MED ORDER — WARFARIN SODIUM 5 MG PO TABS
5.0000 mg | ORAL_TABLET | Freq: Once | ORAL | Status: AC
  Administered 2013-01-16: 5 mg via ORAL
  Filled 2013-01-16: qty 1

## 2013-01-16 MED ORDER — OXYCODONE HCL 5 MG PO TABS
20.0000 mg | ORAL_TABLET | Freq: Every day | ORAL | Status: DC | PRN
  Administered 2013-01-17 – 2013-01-18 (×2): 20 mg via ORAL
  Filled 2013-01-16 (×3): qty 4

## 2013-01-16 MED ORDER — WARFARIN SODIUM 5 MG PO TABS
5.0000 mg | ORAL_TABLET | Freq: Once | ORAL | Status: AC
  Filled 2013-01-16: qty 1
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
## 2013-01-16 NOTE — Progress Notes (Signed)
Subjective: 2 Days Post-Op Procedure(s) (LRB): OPEN REDUCTION INTERNAL FIXATION (ORIF) DISTAL FEMUR FRACTURE (Left) Patient reports pain as 4 on 0-10 scale.  Patient voices strongly that she is NOT going to inpatient rehab   Objective: Vital signs in last 24 hours: Temp:  [97.2 F (36.2 C)-98.4 F (36.9 C)] 97.2 F (36.2 C) (07/26 0557) Pulse Rate:  [67-76] 76 (07/26 0557) Resp:  [15-18] 18 (07/26 0557) BP: (94-155)/(54-91) 94/54 mmHg (07/26 0557) SpO2:  [93 %-99 %] 97 % (07/26 0557)  Intake/Output from previous day: 07/25 0701 - 07/26 0700 In: 240 [P.O.:240] Out: 302 [Urine:302] Intake/Output this shift:     Recent Labs  01/14/13 0516 01/14/13 1707 01/15/13 0823 01/16/13 0425  HGB 9.7* 8.8* 8.9* 8.7*    Recent Labs  01/15/13 0823 01/16/13 0425  WBC 9.1 6.3  RBC 2.68* 2.72*  HCT 25.7* 26.4*  PLT 197 219    Recent Labs  01/15/13 0505 01/16/13 0425  NA 132* 135  K 3.3* 4.0  CL 95* 99  CO2 27 28  BUN 3* 6  CREATININE 0.65 0.70  GLUCOSE 108* 107*  CALCIUM 8.3* 8.2*    Recent Labs  01/16/13 0425  INR 1.09    Sensation intact distally Intact pulses distally Compartment soft Dressing dry Splint intact on left thumb  Assessment/Plan: 2 Days Post-Op Procedure(s) (LRB): OPEN REDUCTION INTERNAL FIXATION (ORIF) DISTAL FEMUR FRACTURE (Left) Discharge home with home health on Monday Will need home DME including platform walker and elevated toilet  Morgan Kelly B 01/16/2013, 8:44 AM
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
## 2013-01-16 NOTE — Progress Notes (Signed)
Seen and examined.  Discussed with Dr. Hess.  Agree with his management and documentation. 
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
## 2013-01-17 LAB — BASIC METABOLIC PANEL
BUN: 7 mg/dL (ref 6–23)
CO2: 30 mEq/L (ref 19–32)
Chloride: 100 mEq/L (ref 96–112)
Glucose, Bld: 87 mg/dL (ref 70–99)
Potassium: 3.9 mEq/L (ref 3.5–5.1)
Sodium: 134 mEq/L — ABNORMAL LOW (ref 135–145)

## 2013-01-17 LAB — CBC
HCT: 25.1 % — ABNORMAL LOW (ref 36.0–46.0)
Hemoglobin: 8.4 g/dL — ABNORMAL LOW (ref 12.0–15.0)
MCH: 32.4 pg (ref 26.0–34.0)
MCHC: 33.5 g/dL (ref 30.0–36.0)
MCV: 96.9 fL (ref 78.0–100.0)
RBC: 2.59 MIL/uL — ABNORMAL LOW (ref 3.87–5.11)

## 2013-01-17 LAB — PROTIME-INR: Prothrombin Time: 17.8 seconds — ABNORMAL HIGH (ref 11.6–15.2)

## 2013-01-17 MED ORDER — ACETAMINOPHEN 500 MG PO TABS: 500.0000 mg | ORAL_TABLET | Freq: Four times a day (QID) | ORAL | Status: DC | PRN

## 2013-01-17 MED ORDER — WARFARIN SODIUM 4 MG PO TABS
4.0000 mg | ORAL_TABLET | Freq: Once | ORAL | Status: AC
  Administered 2013-01-17: 4 mg via ORAL
  Filled 2013-01-17: qty 1
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
## 2013-01-17 NOTE — Progress Notes (Signed)
Subjective: 3 Days Post-Op Procedure(s) (LRB): OPEN REDUCTION INTERNAL FIXATION (ORIF) DISTAL FEMUR FRACTURE (Left) Patient reports pain as 4 on 0-10 scale.    Objective: Vital signs in last 24 hours: Temp:  [97.9 F (36.6 C)-98.7 F (37.1 C)] 98.5 F (36.9 C) (07/27 0619) Pulse Rate:  [64-72] 64 (07/27 0619) Resp:  [18] 18 (07/27 0619) BP: (105-108)/(54-56) 106/54 mmHg (07/27 0619) SpO2:  [96 %-99 %] 99 % (07/27 0619)  Intake/Output from previous day: 07/26 0701 - 07/27 0700 In: 1080 [P.O.:1080] Out: -  Intake/Output this shift:     Recent Labs  01/14/13 1707 01/15/13 0823 01/16/13 0425 01/17/13 0624  HGB 8.8* 8.9* 8.7* 8.4*    Recent Labs  01/16/13 0425 01/17/13 0624  WBC 6.3 4.9  RBC 2.72* 2.59*  HCT 26.4* 25.1*  PLT 219 266    Recent Labs  01/16/13 0425 01/17/13 0624  NA 135 134*  K 4.0 3.9  CL 99 100  CO2 28 30  BUN 6 7  CREATININE 0.70 0.71  GLUCOSE 107* 87  CALCIUM 8.2* 8.1*    Recent Labs  01/16/13 0425 01/17/13 0624  INR 1.09 1.51*    Sensation intact distally Intact pulses distally Incision: no drainage and incisions are healing well with no sign of infection or dehissence No cellulitis present Compartment soft  Assessment/Plan: 3 Days Post-Op Procedure(s) (LRB): OPEN REDUCTION INTERNAL FIXATION (ORIF) DISTAL FEMUR FRACTURE (Left) Up with therapy Plan for discharge tomorrow if PT goals met   Morgan Kelly B 01/17/2013, 10:41 AM
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
## 2013-01-17 NOTE — Progress Notes (Signed)
Pt c/o stinging and urgency on urination. On call family medicine doctor notified. U/A ordered.

## 2013-01-17 NOTE — Progress Notes (Signed)
Seen and examined.  Discussed with Dr. Berline Chough.  Agree with his management.  Plan, as I understand is for DC to home tomorrow.  Rehab recommended but patient refuses.  Will need home health PT and will need INR checks as we bridge lovenox and adjust coumadin.
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
## 2013-01-18 LAB — BASIC METABOLIC PANEL
BUN: 9 mg/dL (ref 6–23)
CO2: 28 mEq/L (ref 19–32)
Chloride: 101 mEq/L (ref 96–112)
GFR calc Af Amer: >90 mL/min (ref >90)
Glucose, Bld: 87 mg/dL (ref 70–99)
Potassium: 4.8 mEq/L (ref 3.5–5.1)

## 2013-01-18 LAB — URINALYSIS, ROUTINE W REFLEX MICROSCOPIC
Bilirubin Urine: NEGATIVE
Glucose, UA: NEGATIVE mg/dL
Nitrite: NEGATIVE
Specific Gravity, Urine: 1.01 (ref 1.005–1.030)
pH: 7.5 (ref 5.0–8.0)

## 2013-01-18 LAB — URINE MICROSCOPIC-ADD ON

## 2013-01-18 LAB — CBC
HCT: 27 % — ABNORMAL LOW (ref 36.0–46.0)
Hemoglobin: 9.1 g/dL — ABNORMAL LOW (ref 12.0–15.0)
MCHC: 33.7 g/dL (ref 30.0–36.0)
MCV: 96.8 fL (ref 78.0–100.0)

## 2013-01-18 LAB — PROTIME-INR: INR: 1.89 — ABNORMAL HIGH (ref 0.00–1.49)

## 2013-01-18 MED ORDER — ACETAMINOPHEN 500 MG PO TABS: 500.0000 mg | ORAL_TABLET | Freq: Four times a day (QID) | ORAL | Status: AC | PRN

## 2013-01-18 MED ORDER — METHOCARBAMOL 500 MG PO TABS: 500.0000 mg | ORAL_TABLET | Freq: Four times a day (QID) | ORAL | Status: DC | PRN

## 2013-01-18 MED ORDER — CIPROFLOXACIN HCL 500 MG PO TABS
500.0000 mg | ORAL_TABLET | Freq: Two times a day (BID) | ORAL | Status: DC
  Administered 2013-01-18: 500 mg via ORAL
  Filled 2013-01-18 (×4): qty 1

## 2013-01-18 MED ORDER — ENOXAPARIN SODIUM 40 MG/0.4ML ~~LOC~~ SOLN: 40.0000 mg | Freq: Every day | SUBCUTANEOUS | Status: DC

## 2013-01-18 MED ORDER — WARFARIN SODIUM 2 MG PO TABS
2.0000 mg | ORAL_TABLET | Freq: Once | ORAL | Status: DC
  Filled 2013-01-18: qty 1

## 2013-01-18 MED ORDER — DSS 100 MG PO CAPS: 100.0000 mg | ORAL_CAPSULE | Freq: Two times a day (BID) | ORAL | Status: AC

## 2013-01-18 MED ORDER — WARFARIN VIDEO: 1.0000 | Freq: Once | Status: DC

## 2013-01-18 MED ORDER — OXYCODONE HCL 20 MG PO TABS: 20.0000 mg | ORAL_TABLET | Freq: Four times a day (QID) | ORAL | Status: DC | PRN

## 2013-01-18 MED ORDER — FERROUS SULFATE 325 (65 FE) MG PO TABS: 325.0000 mg | ORAL_TABLET | Freq: Three times a day (TID) | ORAL | Status: AC

## 2013-01-18 MED ORDER — CIPROFLOXACIN HCL 500 MG PO TABS: 500.0000 mg | ORAL_TABLET | Freq: Two times a day (BID) | ORAL | Status: DC

## 2013-01-18 MED ORDER — FLUCONAZOLE 150 MG PO TABS: 150.0000 mg | ORAL_TABLET | Freq: Once | ORAL | Status: DC

## 2013-01-18 MED ORDER — OXYCODONE HCL 30 MG PO TABS: 30.0000 mg | ORAL_TABLET | Freq: Two times a day (BID) | ORAL | Status: DC

## 2013-01-18 MED ORDER — WARFARIN SODIUM 2 MG PO TABS: 2.0000 mg | ORAL_TABLET | Freq: Every day | ORAL | Status: DC
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
## 2013-01-18 NOTE — Progress Notes (Signed)
[facility] PHYSICAL MEDICINE AND REHABILITATION  CONSULT SERVICE NOTE  Spoke with pt as well as current providers. Inpatient rehab is recommended, but the patient adamantly refuses. She is competent to make decisions regarding her care. Home care to be arranged by primary team.  Ranelle Oyster, MD, [facility]
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
## 2013-01-18 NOTE — Progress Notes (Signed)
Patient's case discussed with resident team and I agree with their findings/plan.  Paula Compton, MD

## 2013-01-18 NOTE — Progress Notes (Signed)
Orthopedic Tech Progress Note Patient Details:  HANG AMMON 1951/05/14 [id number] Removed existing thumb spica splint per order, applied plaster thumb spica splint to LUE.  Pulses, capillary refill, sensation, movement of fingers intact before and after splinting.  Capillary refill less than 2 seconds. Ortho Devices Type of Ortho Device: Thumb spica splint Splint Material: Plaster Ortho Device/Splint Location: LUE Ortho Device/Splint Interventions: Application   Lesle Chris 01/18/2013, 11:03 AM
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
## 2013-01-18 NOTE — Progress Notes (Signed)
  Family Medicine Teaching Service Daily Progress Note Intern Pager: [phone]  Patient name: Morgan Kelly Medical record number: [id number] Date of birth: 06-Dec-1950 Age: 62 y.o. Gender: female  Primary Care Provider: Tonye Pearson, MD Code Status: Full Code  Pt Overview and Major Events to Date:  OR for ORIF of supracondylar femur fx L leg on 7/24  Assessment and Plan: Morgan Kelly is a 62 y.o. year old female presenting with supracondylar fx. S/p ORIF post op day #4.  #Supracondylar Fx, L leg - Managed by Orthopaedics, will continue to follow along - Pain per [facility] team - PT/OT per [facility] - Pt refused CIR. Will be going home with home health PT/OT/home health needs set up by ortho - d/c today  # UTI - burning on urination and urgency. On UA, leukocyte esterase was elevated with few bacteria and no nitrites. - Ortho started her on ciprofloxacin  # Left Thumb Fracture: s/p reduction and splinting by Dr. Janee Morn  # VTE prophylaxis with hx of previous PE and factor 5 lieden disorder - per ortho, lovenox bridge to coumadin; coumadin x8 weeks; SCDs currently  # Hx of Asthma  -Continue home medications  #Hx of GAD/Depression - Continue home Xanax, Celexa, Elavil  >Consider Cymbalta for Mood and Pain modulation but given hx of ?BiPolar Disorder will defer to OP team and reconsider OP PSYCH eval  #Hx of Hypothyroidism? -Pt on synthroid 200 mcg in outpatient setting -No formal dx of this, but will continue while inpatient   #Pre-operative assessment/AKI on admission (currently resolved).  #Hx of Factor 5 Leiden deficiency  -Pt reports hx of this, tx dose lovenox w/ coumadin   FEN/GI: KVO, regular diet  PPx: Lovenox bridge to warfarin + SCD  Disposition: Pending ortho clearance  Subjective: Morgan Kelly continues to insistent on going home. PM&R was in the room and she told them that she did not want inpatient rehabilitation. She understood the benefits  and concerns the doctor had, but still wanted to go home. She had no complaints otherwise.  Objective: Temp:  [98.1 F (36.7 C)-98.2 F (36.8 C)] 98.1 F (36.7 C) (07/28 0648) Pulse Rate:  [65-72] 65 (07/28 0648) Resp:  [18] 18 (07/28 0648) BP: (126-127)/(61-62) 127/62 mmHg (07/28 0648) SpO2:  [97 %-100 %] 97 % (07/28 1002)  Physical Exam: General: No acute distress, comfortable, laying in bed PSYCH: Insistent on going home. Continues to have poor judgement Insight is good. Cardiovascular: RRR, no murmurs appreciated  Respiratory: clear to auscultation bilaterally, no wheezes Abdomen: Soft, non-tender, non-distended, bowel sounds present Extremities: Left leg bandaged up to thigh, sensation intact distally, able to wiggle toes Skin: no rashes  Neuro: no focal deficits, alert & oriented x3  Laboratory:  Recent Labs Lab 01/16/13 0425 01/17/13 0624 01/18/13 0410  WBC 6.3 4.9 5.4  HGB 8.7* 8.4* 9.1*  HCT 26.4* 25.1* 27.0*  PLT 219 266 341    Recent Labs Lab 01/16/13 0425 01/17/13 0624 01/18/13 0410  NA 135 134* 134*  K 4.0 3.9 4.8  CL 99 100 101  CO2 28 30 28   BUN 6 7 9   CREATININE 0.70 0.71 0.72  CALCIUM 8.2* 8.1* 8.7  GLUCOSE 107* 87 87    Jacquelin Hawking, MD 01/18/2013, 11:55 AM PGY-1, [place] [facility] FPTS Intern pager: [phone], text pages welcome

## 2013-01-18 NOTE — Progress Notes (Signed)
Rehab admissions - Evaluated for possible admission.  Patient is refusing inpatient rehab services.  Please see note from Dr. Riley Kill today.  Will need to explore home with [facility] services.  Call me for questions.  #[phone]

## 2013-01-18 NOTE — Progress Notes (Signed)
01/18/13 Spoke with patient about HHC,she selected [facility]. Contacted [facility] and set up HHPT, HHOT and HHRN. [employer] delivered left platform rolling walker and 3N1 to patient. She states that her sister will be able to assist her and she does not want to go to rehab. Jacquelynn Cree RN, BSN, CCM

## 2013-01-19 ENCOUNTER — Telehealth: Payer: Self-pay

## 2013-01-19 LAB — URINE CULTURE

## 2013-01-19 NOTE — Telephone Encounter (Signed)
i'll be out of town next week Could see her this Sat--I think I work Could see her the following week 8/13 as add on at end of day

## 2013-01-19 NOTE — Telephone Encounter (Signed)
Patient advised.
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
## 2013-01-20 ENCOUNTER — Ambulatory Visit: Payer: Medicare Other | Admitting: Internal Medicine

## 2013-01-22 NOTE — Progress Notes (Signed)
I saw and examined the patient and agree with the above assessment and plan.  Myrene Galas, MD [facility] [phone] (p)

## 2013-01-23 ENCOUNTER — Ambulatory Visit (INDEPENDENT_AMBULATORY_CARE_PROVIDER_SITE_OTHER): Payer: Medicare Other | Admitting: Internal Medicine

## 2013-01-23 ENCOUNTER — Telehealth: Payer: Self-pay | Admitting: Family Medicine

## 2013-01-23 VITALS — HR 61 | Temp 98.1°F | Resp 16 | Ht 61.0 in | Wt 130.0 lb

## 2013-01-23 DIAGNOSIS — N760 Acute vaginitis: Secondary | ICD-10-CM

## 2013-01-23 DIAGNOSIS — S63105D Unspecified dislocation of left thumb, subsequent encounter: Secondary | ICD-10-CM

## 2013-01-23 DIAGNOSIS — Z7901 Long term (current) use of anticoagulants: Secondary | ICD-10-CM

## 2013-01-23 DIAGNOSIS — G8929 Other chronic pain: Secondary | ICD-10-CM

## 2013-01-23 DIAGNOSIS — J45909 Unspecified asthma, uncomplicated: Secondary | ICD-10-CM

## 2013-01-23 DIAGNOSIS — J45998 Other asthma: Secondary | ICD-10-CM

## 2013-01-23 DIAGNOSIS — Z5189 Encounter for other specified aftercare: Secondary | ICD-10-CM

## 2013-01-23 LAB — POCT WET PREP WITH KOH
KOH Prep POC: NEGATIVE
RBC Wet Prep HPF POC: NEGATIVE

## 2013-01-23 MED ORDER — METRONIDAZOLE 500 MG PO TABS: 500.0000 mg | ORAL_TABLET | Freq: Two times a day (BID) | ORAL | Status: DC

## 2013-01-23 MED ORDER — OXYCODONE HCL 30 MG PO TABS: 30.0000 mg | ORAL_TABLET | Freq: Two times a day (BID) | ORAL | Status: DC

## 2013-01-23 MED ORDER — OXYCODONE HCL 20 MG PO TABS: 20.0000 mg | ORAL_TABLET | Freq: Four times a day (QID) | ORAL | Status: DC | PRN

## 2013-01-23 MED ORDER — FLUTICASONE-SALMETEROL 250-50 MCG/DOSE IN AEPB: 1.0000 | INHALATION_SPRAY | Freq: Two times a day (BID) | RESPIRATORY_TRACT | Status: DC

## 2013-01-23 MED ORDER — LIDOCAINE 5 % EX PTCH: 3.0000 | MEDICATED_PATCH | CUTANEOUS | Status: DC

## 2013-01-23 NOTE — Progress Notes (Signed)
Subjective:    Patient ID: Morgan Kelly, female    DOB: [birth year], 62 y.o.   MRN: [id number]  HPIsee chart fx femur w/ repair//disloc thumb anticoag-needs PT/INR Swollen leg with dependent increase but no uncontrolled pain Bladder infec post catheter on cipro 2 d to go but still having some urgency Vag d/c continues despite diflucan in hosp 7 days- Anemia post injury/surgery  Current outpatient prescriptions:acetaminophen (TYLENOL) 500 MG tablet, Take 1-2 tablets (500-1,000 mg total) by mouth every 6 (six) hours as needed for pain or fever., Disp: 30 tablet, Rfl: 0;  albuterol (PROVENTIL HFA;VENTOLIN HFA) 108 (90 BASE) MCG/ACT inhaler, Inhale 1-2 puffs into the lungs every 6 (six) hours as needed for wheezing or shortness of breath., Disp: , Rfl:  alprazolam (XANAX) 2 MG tablet, Take 1 tablet (2 mg total) by mouth 3 (three) times daily as needed for anxiety., Disp: 90 tablet, Rfl: 2;   amitriptyline (ELAVIL) 50 MG tablet, Take 50-100 mg by mouth at bedtime., Disp: , Rfl: ;   belladonna alk-PHENObarbital (DONNATAL) 16.2 MG tablet, Take 1 tablet by mouth every 8 (eight) hours as needed. , Disp: , Rfl:  ciprofloxacin (CIPRO) 500 MG tablet, Take 1 tablet (500 mg total) by mouth 2 (two) times daily., Disp: 14 tablet, Rfl: 0;   citalopram (CELEXA) 40 MG tablet, Take 1 tablet (40 mg total) by mouth daily., Disp: 90 tablet, Rfl: 3;  clindamycin (CLEOCIN T) 1 % lotion, Apply 1 application topically daily as needed. Apply sparingly and massage in., Disp: , Rfl:  diphenoxylate-atropine (LOMOTIL) 2.5-0.025 MG per tablet, Take 1 tablet by mouth 4 (four) times daily as needed. diarrhea, Disp: 30 tablet, Rfl: 0;  docusate sodium 100 MG CAPS, Take 100 mg by mouth 2 (two) times daily., Disp: 10 capsule, Rfl: 0;   enoxaparin (LOVENOX) 40 MG/0.4ML injection, Inject 0.4 mLs (40 mg total) into the skin daily., Disp: 4 Syringe, Rfl: 0 ferrous sulfate 325 (65 FE) MG tablet, Take 1 tablet (325 mg total) by mouth  3 (three) times daily after meals., Disp: 90 tablet, Rfl: 0;   fluconazole (DIFLUCAN) 150 MG tablet, Take 1 tablet (150 mg total) by mouth once., Disp: 2 tablet, Rfl: 0;   Fluticasone-Salmeterol (ADVAIR) 250-50 MCG/DOSE AEPB, Inhale 1 puff into the lungs every 12 (twelve) hours., Disp: , Rfl:  furosemide (LASIX) 20 MG tablet, Take 20 mg by mouth See admin instructions. Take every three days as needed for swelling/edema., Disp: , Rfl: ;   levothyroxine (SYNTHROID, LEVOTHROID) 200 MCG tablet, Take 1 tablet (200 mcg total) by mouth daily., Disp: 90 tablet, Rfl: 3;   lidocaine (LIDODERM) 5 %, Place 3 patches onto the skin daily. Remove & Discard patch within 12 hours or as directed by MD, Disp: , Rfl:  loratadine (CLARITIN) 10 MG tablet, Take 10 mg by mouth daily as needed for allergies. , Disp: , Rfl: ;  Magnesium Hydroxide (MILK OF MAGNESIA PO), Take 5-10 mLs by mouth daily. Constipation, Disp: , Rfl: ;   methocarbamol (ROBAXIN) 500 MG tablet, Take 1-2 tablets (500-1,000 mg total) by mouth 4 (four) times daily as needed (spasms)., Disp: 80 tablet, Rfl: 0;   montelukast (SINGULAIR) 10 MG tablet, Take 10 mg by mouth at bedtime., Disp: , Rfl:  oxyCODONE (ROXICODONE) 30 MG immediate release tablet, Take 1 tablet (30 mg total) by mouth 2 (two) times daily., Disp: 60 tablet, Rfl: 0;  oxyCODONE 20 MG TABS, Take 1 tablet (20 mg total) by mouth every 6 (six) hours as needed  for pain., Disp: 120 tablet, Rfl: 0;   spironolactone (ALDACTONE) 100 MG tablet, Take 100 mg by mouth daily as needed (to control acne). , Disp: , Rfl:  warfarin (COUMADIN) 2 MG tablet, Take 1 tablet (2 mg total) by mouth daily., Disp: 90 tablet, Rfl: 1;  warfarin (COUMADIN) MISC, 1 each by Does not apply route once., Disp: 1 each, Rfl: 0    chronic pain meds for 4 weeks given by Dr Renae Fickle 7/28  Review of Systems No fever chills or night sweats No appetite change No weight loss No chest pain shortness of breath or palpitations     Objective:   Physical Exam BP   Pulse 61  Temp(Src) 98.1 F (36.7 C) (Oral)  Resp 16  Ht 5\' 1"  (1.549 m)  Wt 130 lb (58.968 kg)  BMI 24.58 kg/m2  SpO2 98% No acute distress Her brace is down around the ankle She is alert and oriented Lungs are clear Heart is regular Left thumb is bandaged as well  Bandages removed after braces removed and the wound looks healthy without signs of infection Good peripheral pulses/no sensory losses/no calf or thigh swelling posteriorly Mood is good/affect is appropriate      Results for orders placed in visit on 01/23/13  POCT WET PREP WITH KOH      Result Value Range   Trichomonas, UA Negative     Clue Cells Wet Prep HPF POC tntc     Epithelial Wet Prep HPF POC neg     Yeast Wet Prep HPF POC neg     Bacteria Wet Prep HPF POC 5+     RBC Wet Prep HPF POC neg     WBC Wet Prep HPF POC tntc     KOH Prep POC Negative    procedure= re\re bandaged and braced replaced in correct position  Assessment & Plan:  Fractured femur --to followup with Dr. Carola Frost in 3 days  Dislocation of left thumb, subsequent encounter  Warfarin anticoagulation - Plan: Protime-INR, CANCELED: Protime-INR  Vaginitis and vulvovaginitis due to bacterial vaginosis -   metroNIDAZOLE (FLAGYL) 500 MG tablet  Chronic pain--- medications refilled for August 27 with follow up appointment planned in September  Lidoderm patches refilled  Asthma in remission-refill Advair  Post catheter UTI-2 complete Cipro and then repeat urine  50 minute OV  Meds ordered this encounter  Medications  . lidocaine (LIDODERM) 5 %    Sig: Place 3 patches onto the skin daily. Remove & Discard patch within 12 hours or as directed by MD    Dispense:  90 patch    Refill:  5  . Fluticasone-Salmeterol (ADVAIR) 250-50 MCG/DOSE AEPB    Sig: Inhale 1 puff into the lungs every 12 (twelve) hours.    Dispense:  60 each    Refill:  5  . Oxycodone HCl 20 MG TABS    Sig: Take 1 tablet (20 mg total)  by mouth every 6 (six) hours as needed. For 02/17/13 or after    Dispense:  120 tablet    Refill:  0    Order Specific Question:      Answer:    . oxycodone (ROXICODONE) 30 MG immediate release tablet    Sig: Take 1 tablet (30 mg total) by mouth 2 (two) times daily. For 01/2713 or after    Dispense:  60 tablet    Refill:  0    Order Specific Question:      Answer:    . metroNIDAZOLE (  FLAGYL) 500 MG tablet    Sig: Take 1 tablet (500 mg total) by mouth 2 (two) times daily.    Dispense:  14 tablet    Refill:  0

## 2013-01-23 NOTE — Telephone Encounter (Signed)
Called patient but does not have VM set up. Unable to LM. Needed to let her know her nurse, Judeth Cornfield with [facility], called and wanted her to call back. [phone]

## 2013-01-27 ENCOUNTER — Encounter (HOSPITAL_BASED_OUTPATIENT_CLINIC_OR_DEPARTMENT_OTHER): Payer: Self-pay | Admitting: *Deleted

## 2013-01-27 ENCOUNTER — Other Ambulatory Visit: Payer: Self-pay | Admitting: Orthopedic Surgery

## 2013-01-28 ENCOUNTER — Encounter (HOSPITAL_BASED_OUTPATIENT_CLINIC_OR_DEPARTMENT_OTHER): Payer: Self-pay | Admitting: *Deleted

## 2013-01-28 ENCOUNTER — Telehealth: Payer: Self-pay | Admitting: Radiology

## 2013-01-28 NOTE — Progress Notes (Signed)
Set up overbook appt for 9/17 at 10am. Unable to reach pt via phone, sent reminder letter with appt info.

## 2013-01-28 NOTE — Telephone Encounter (Signed)
Patient states someone called, I did not. Dr Carola Frost wants to speak to Dr Merla Riches about the pain medications. He wants you to call his office.

## 2013-01-28 NOTE — Telephone Encounter (Signed)
Best number [phone]  Pt is schedule to have surgery on Tuesday of next week and needs to start her novalox now and dr Janee Morn spoke with dr handy about coumidan needs an order for novalox bridge
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
## 2013-01-29 ENCOUNTER — Telehealth: Payer: Self-pay | Admitting: Radiology

## 2013-01-29 NOTE — Telephone Encounter (Signed)
Dr Carola Frost is asking for a lovenox bridge. Please advise.

## 2013-01-29 NOTE — Telephone Encounter (Signed)
Patient indicates she is very painful, is doubling up on her meds. I have advised her she CAN NOT do this, she is advised she will stop breathing, or not wake up. Dr Cleta Alberts witnessed this conversation with patient.

## 2013-01-29 NOTE — Progress Notes (Signed)
Pt stopped coumadin 01/26/13-has [facility] coming today to do INR-to call results

## 2013-01-29 NOTE — Progress Notes (Signed)
[facility] nurse called in pt INR today=1.2

## 2013-01-29 NOTE — Telephone Encounter (Signed)
Dr Cleta Alberts has spoken to Dr Carola Frost about patient we can not write any additional medications for her, he is concerned about her withdrawing from such high doses of meds. Patient should go to behavioral health at [facility] if she begins to have signs of withdrawal. I have called her to advise, she did not answer. Will try again tomorrow, since this is when she will run out of meds.

## 2013-01-29 NOTE — Telephone Encounter (Signed)
Patient has already taken ALL of her oxycodone tomorrow.  Gwen Dr Carola Frost call back number phone number [phone] (she is advised of following message).   Called pharmacy to clarify this, Oxycodone 20 mg was filled Dr Renae Fickle 7/28 #120 , Oxycodone 30mg  IR was filled on 01/18/13 #60 to take bid. Patient not due until 02/18/13. This is 20 days early.  Patient advised also.

## 2013-01-30 NOTE — Telephone Encounter (Signed)
Pt is doing ok and is to have surgery on Monday.  She does not need any medications and will go to the hospital if she needs.

## 2013-02-01 ENCOUNTER — Encounter (HOSPITAL_BASED_OUTPATIENT_CLINIC_OR_DEPARTMENT_OTHER): Payer: Self-pay | Admitting: Anesthesiology

## 2013-02-01 ENCOUNTER — Ambulatory Visit (HOSPITAL_BASED_OUTPATIENT_CLINIC_OR_DEPARTMENT_OTHER)
Admission: RE | Admit: 2013-02-01 | Discharge: 2013-02-01 | Disposition: A | Discharge status: Home / Self Care | Payer: Medicare Other | Source: Ambulatory Visit | Attending: Orthopedic Surgery | Admitting: Orthopedic Surgery

## 2013-02-01 ENCOUNTER — Ambulatory Visit (HOSPITAL_COMMUNITY): Payer: Medicare Other

## 2013-02-01 ENCOUNTER — Ambulatory Visit (HOSPITAL_BASED_OUTPATIENT_CLINIC_OR_DEPARTMENT_OTHER): Payer: Medicare Other | Admitting: Anesthesiology

## 2013-02-01 ENCOUNTER — Encounter (HOSPITAL_BASED_OUTPATIENT_CLINIC_OR_DEPARTMENT_OTHER)
Admission: RE | Disposition: A | Discharge status: Home / Self Care | Payer: Self-pay | Source: Ambulatory Visit | Attending: Orthopedic Surgery

## 2013-02-01 DIAGNOSIS — Z79899 Other long term (current) drug therapy: Secondary | ICD-10-CM | POA: Insufficient documentation

## 2013-02-01 DIAGNOSIS — G709 Myoneural disorder, unspecified: Secondary | ICD-10-CM | POA: Insufficient documentation

## 2013-02-01 DIAGNOSIS — E039 Hypothyroidism, unspecified: Secondary | ICD-10-CM | POA: Insufficient documentation

## 2013-02-01 DIAGNOSIS — J45909 Unspecified asthma, uncomplicated: Secondary | ICD-10-CM | POA: Insufficient documentation

## 2013-02-01 DIAGNOSIS — K219 Gastro-esophageal reflux disease without esophagitis: Secondary | ICD-10-CM | POA: Insufficient documentation

## 2013-02-01 DIAGNOSIS — M24443 Recurrent dislocation, unspecified hand: Secondary | ICD-10-CM | POA: Insufficient documentation

## 2013-02-01 DIAGNOSIS — I739 Peripheral vascular disease, unspecified: Secondary | ICD-10-CM | POA: Insufficient documentation

## 2013-02-01 HISTORY — PX: CLOSED REDUCTION FINGER WITH PERCUTANEOUS PINNING: SHX5612

## 2013-02-01 HISTORY — DX: Other pulmonary embolism without acute cor pulmonale: I26.99

## 2013-02-01 LAB — POCT HEMOGLOBIN-HEMACUE: Hemoglobin: 12.4 g/dL (ref 12.0–15.0)

## 2013-02-01 SURGERY — CLOSED REDUCTION, FINGER, WITH PERCUTANEOUS PINNING
Anesthesia: General | Site: Hand | Laterality: Left | Wound class: Clean

## 2013-02-01 MED ORDER — HYDROCODONE-ACETAMINOPHEN 5-325 MG PO TABS: 1.0000 | ORAL_TABLET | ORAL | Status: DC | PRN

## 2013-02-01 MED ORDER — OXYCODONE-ACETAMINOPHEN 5-325 MG PO TABS
1.0000 | ORAL_TABLET | ORAL | Status: DC | PRN
Start: 2013-02-01 — End: 2013-02-01

## 2013-02-01 MED ORDER — LIDOCAINE HCL (CARDIAC) 20 MG/ML IV SOLN
INTRAVENOUS | Status: DC | PRN
  Administered 2013-02-01: 60 mg via INTRAVENOUS

## 2013-02-01 MED ORDER — HYDROMORPHONE HCL PF 1 MG/ML IJ SOLN: 0.5000 mg | INTRAMUSCULAR | Status: DC | PRN

## 2013-02-01 MED ORDER — BUPIVACAINE-EPINEPHRINE PF 0.5-1:200000 % IJ SOLN
INTRAMUSCULAR | Status: DC | PRN
  Administered 2013-02-01: 7 mL

## 2013-02-01 MED ORDER — FENTANYL CITRATE 0.05 MG/ML IJ SOLN
25.0000 ug | INTRAMUSCULAR | Status: DC | PRN
  Administered 2013-02-01 (×2): 50 ug via INTRAVENOUS

## 2013-02-01 MED ORDER — FENTANYL CITRATE 0.05 MG/ML IJ SOLN
50.0000 ug | INTRAMUSCULAR | Status: DC | PRN
  Administered 2013-02-01: 100 ug via INTRAVENOUS

## 2013-02-01 MED ORDER — MIDAZOLAM HCL 2 MG/2ML IJ SOLN
1.0000 mg | INTRAMUSCULAR | Status: DC | PRN
  Administered 2013-02-01: 2 mg via INTRAVENOUS

## 2013-02-01 MED ORDER — MIDAZOLAM HCL 5 MG/5ML IJ SOLN
INTRAMUSCULAR | Status: DC | PRN
  Administered 2013-02-01: 2 mg via INTRAVENOUS

## 2013-02-01 MED ORDER — OXYCODONE-ACETAMINOPHEN 10-325 MG PO TABS: 1.0000 | ORAL_TABLET | ORAL | Status: DC | PRN

## 2013-02-01 MED ORDER — ONDANSETRON HCL 4 MG/2ML IJ SOLN
INTRAMUSCULAR | Status: DC | PRN
  Administered 2013-02-01: 4 mg via INTRAVENOUS

## 2013-02-01 MED ORDER — LACTATED RINGERS IV SOLN
INTRAVENOUS | Status: DC
  Administered 2013-02-01 (×2): via INTRAVENOUS

## 2013-02-01 MED ORDER — CEFAZOLIN SODIUM-DEXTROSE 2-3 GM-% IV SOLR
2.0000 g | INTRAVENOUS | Status: AC
  Administered 2013-02-01: 2 g via INTRAVENOUS

## 2013-02-01 MED ORDER — FENTANYL CITRATE 0.05 MG/ML IJ SOLN
INTRAMUSCULAR | Status: DC | PRN
  Administered 2013-02-01 (×2): 50 ug via INTRAVENOUS

## 2013-02-01 MED ORDER — PROPOFOL 10 MG/ML IV BOLUS
INTRAVENOUS | Status: DC | PRN
  Administered 2013-02-01: 200 mg via INTRAVENOUS

## 2013-02-01 MED ORDER — ENOXAPARIN SODIUM 40 MG/0.4ML ~~LOC~~ SOLN: 40.0000 mg | SUBCUTANEOUS | Status: DC

## 2013-02-01 MED ORDER — METOCLOPRAMIDE HCL 5 MG/ML IJ SOLN: 10.0000 mg | Freq: Once | INTRAMUSCULAR | Status: DC | PRN

## 2013-02-01 MED ORDER — LACTATED RINGERS IV SOLN: INTRAVENOUS | Status: DC

## 2013-02-01 SURGICAL SUPPLY — 49 items
BANDAGE COBAN STERILE 2 (GAUZE/BANDAGES/DRESSINGS) IMPLANT
BANDAGE GAUZE ELAST BULKY 4 IN (GAUZE/BANDAGES/DRESSINGS) IMPLANT
BLADE MINI RND TIP GREEN BEAV (BLADE) IMPLANT
BLADE SURG 15 STRL LF DISP TIS (BLADE) ×1 IMPLANT
BLADE SURG 15 STRL SS (BLADE) ×2
BNDG CMPR 9X4 STRL LF SNTH (GAUZE/BANDAGES/DRESSINGS) ×1
BNDG COHESIVE 1X5 TAN STRL LF (GAUZE/BANDAGES/DRESSINGS) IMPLANT
BNDG COHESIVE 4X5 TAN STRL (GAUZE/BANDAGES/DRESSINGS) IMPLANT
BNDG ESMARK 4X9 LF (GAUZE/BANDAGES/DRESSINGS) ×1 IMPLANT
CHLORAPREP W/TINT 26ML (MISCELLANEOUS) ×2 IMPLANT
CORDS BIPOLAR (ELECTRODE) IMPLANT
COVER MAYO STAND STRL (DRAPES) ×2 IMPLANT
COVER TABLE BACK 60X90 (DRAPES) ×2 IMPLANT
CUFF TOURNIQUET SINGLE 18IN (TOURNIQUET CUFF) ×2 IMPLANT
DRAPE C-ARM 42X72 X-RAY (DRAPES) ×2 IMPLANT
DRAPE EXTREMITY T 121X128X90 (DRAPE) ×2 IMPLANT
DRAPE SURG 17X23 STRL (DRAPES) ×2 IMPLANT
DRSG EMULSION OIL 3X3 NADH (GAUZE/BANDAGES/DRESSINGS) ×2 IMPLANT
GLOVE BIO SURGEON STRL SZ7.5 (GLOVE) ×2 IMPLANT
GLOVE BIOGEL PI IND STRL 6.5 (GLOVE) ×1 IMPLANT
GLOVE BIOGEL PI IND STRL 7.0 (GLOVE) IMPLANT
GLOVE BIOGEL PI IND STRL 8 (GLOVE) ×1 IMPLANT
GLOVE BIOGEL PI INDICATOR 6.5 (GLOVE) ×1
GLOVE BIOGEL PI INDICATOR 7.0 (GLOVE) ×1
GLOVE BIOGEL PI INDICATOR 8 (GLOVE) ×1
GLOVE EXAM NITRILE MD LF STRL (GLOVE) ×1 IMPLANT
GOWN PREVENTION PLUS XLARGE (GOWN DISPOSABLE) ×4 IMPLANT
K-WIRE .062 (WIRE) ×2
K-WIRE FX6X.062X2 END TROC (WIRE) ×1
KWIRE FX6X.062X2 END TROC (WIRE) IMPLANT
NEEDLE HYPO 22GX1.5 SAFETY (NEEDLE) ×1 IMPLANT
NS IRRIG 1000ML POUR BTL (IV SOLUTION) ×1 IMPLANT
PACK BASIN DAY SURGERY FS (CUSTOM PROCEDURE TRAY) ×2 IMPLANT
PAD CAST 4YDX4 CTTN HI CHSV (CAST SUPPLIES) IMPLANT
PADDING CAST ABS 4INX4YD NS (CAST SUPPLIES)
PADDING CAST ABS COTTON 4X4 ST (CAST SUPPLIES) IMPLANT
PADDING CAST COTTON 4X4 STRL (CAST SUPPLIES)
PADDING UNDERCAST 2  STERILE (CAST SUPPLIES) IMPLANT
RUBBERBAND STERILE (MISCELLANEOUS) IMPLANT
SLEEVE SCD COMPRESS KNEE MED (MISCELLANEOUS) ×2 IMPLANT
SPLINT FIBERGLASS 3X35 (CAST SUPPLIES) ×1 IMPLANT
SPONGE GAUZE 4X4 12PLY (GAUZE/BANDAGES/DRESSINGS) ×2 IMPLANT
STOCKINETTE 4X48 STRL (DRAPES) ×2 IMPLANT
SUT VICRYL RAPIDE 4/0 PS 2 (SUTURE) IMPLANT
SYR BULB 3OZ (MISCELLANEOUS) ×2 IMPLANT
SYRINGE 10CC LL (SYRINGE) ×1 IMPLANT
TOWEL OR 17X24 6PK STRL BLUE (TOWEL DISPOSABLE) ×2 IMPLANT
TOWEL OR NON WOVEN STRL DISP B (DISPOSABLE) ×2 IMPLANT
UNDERPAD 30X30 INCONTINENT (UNDERPADS AND DIAPERS) ×2 IMPLANT

## 2013-02-01 NOTE — Transfer of Care (Signed)
Immediate Anesthesia Transfer of Care Note  Patient: Morgan Kelly  Procedure(s) Performed: Procedure(s): OPEN REDUCTION, PERCUTANEOUS PINNING (Left)  Patient Location: PACU  Anesthesia Type:General  Level of Consciousness: awake, alert , oriented and patient cooperative  Airway & Oxygen Therapy: Patient Spontanous Breathing and Patient connected to face mask oxygen  Post-op Assessment: Report given to PACU RN and Post -op Vital signs reviewed and stable  Post vital signs: Reviewed and stable  Complications: No apparent anesthesia complications

## 2013-02-01 NOTE — Anesthesia Procedure Notes (Signed)
Procedure Name: LMA Insertion Date/Time: 02/01/2013 10:25 AM Performed by: Taccara Bushnell D Pre-anesthesia Checklist: Patient identified, Emergency Drugs available, Suction available and Patient being monitored Patient Re-evaluated:Patient Re-evaluated prior to inductionOxygen Delivery Method: Circle System Utilized Preoxygenation: Pre-oxygenation with 100% oxygen Intubation Type: IV induction Ventilation: Mask ventilation without difficulty LMA: LMA inserted LMA Size: 4.0 Number of attempts: 1 Airway Equipment and Method: bite block Placement Confirmation: positive ETCO2 Tube secured with: Tape Dental Injury: Teeth and Oropharynx as per pre-operative assessment

## 2013-02-01 NOTE — Op Note (Signed)
02/01/2013  9:03 AM  PATIENT:  Morgan Kelly  62 y.o. female  PRE-OPERATIVE DIAGNOSIS:  Left thumb recurrent dislocation IPJ  POST-OPERATIVE DIAGNOSIS:  Same  PROCEDURE:  Left thumb IPJ open reduction and percutaneous stabilization  SURGEON: Cliffton Asters. Janee Morn, MD  PHYSICIAN ASSISTANT: None  ANESTHESIA:  general  SPECIMENS:  None  DRAINS:   None  PREOPERATIVE INDICATIONS:  [name] is a  62 y.o. female with a diagnosis of recurrent left thumb IPJ dislocation  The risks benefits and alternatives were discussed with the patient preoperatively including but not limited to the risks of infection, bleeding, nerve injury, cardiopulmonary complications, the need for revision surgery, among others, and the patient verbalized understanding and consented to proceed.  OPERATIVE IMPLANTS: 0.062 inch K wire  OPERATIVE FINDINGS: Concentric reduction following open reduction and percutaneous stabilization  OPERATIVE PROCEDURE:  After receiving prophylactic antibiotics, the patient was escorted to the operative theatre and placed in a supine position.  A surgical "time-out" was performed during which the planned procedure, proposed operative site, and the correct patient identity were compared to the operative consent and agreement confirmed by the circulating nurse according to current facility policy.  A digital block was instilled at the base of the digit with Marcaine bearing epinephrine. Following application of a tourniquet to the operative extremity, the exposed skin was prepped with Chloraprep and draped in the usual sterile fashion.  The limb was exsanguinated with an Esmarch bandage and the tourniquet inflated to approximately higher than systolic BP.  Attempted closed reduction initially were unsuccessful. A small longitudinal incision was made on the dorsal radial aspect overlying the IPJ and through this a Therapist, nutritional was introduced and used to help gain  reduction of the IP joint. The reduction appeared to be concentric. A 0.062 inch K wire was advanced and first attempt was made to drive it from proximal radial to distal ulnar and this provided insufficient stability. It was then driven from proximal ulnar to distal radial and this proved satisfactory, holding the joint reduced in slight flexion. Tourniquet was released and the distal hemostasis and necessary and the pin was clipped and bent over at the skin. It was dressed with gauze and web roll and then a short arm thumb spica cast was applied with stockinette under it. She was awakened and taken to the recovery room in stable condition  DISPOSITION: This patient will be discharged home, returning in 10-15 days for reevaluation with new x-rays of the left thumb in a cast.

## 2013-02-01 NOTE — H&P (Signed)
Morgan Kelly is an 62 y.o. female.   CC / Reason for Visit: Left thumb injury HPI: This patient is a 62 year old female who presents for reevaluation of her left thumb.  I was consulted while she was an inpatient, prior to surgical treatment of her left femur fracture.  I reduced her left thumb IP dislocation and placed a sugar tong splint with the IP joint about 30 flexed.  It was stable and reduced.  The discharge plan was for her to return in 2 weeks for reevaluation.  She presents today with a different splint wrapped around her distal forearm and not evening, placing the thumb.  It is somewhat disheveled.  She reports that prior to her leaving the hospital the splint that I placed was removed and replaced by a different splint, which is the one she has on today.    Past Medical History  Diagnosis Date  . Fibromyalgia   . Chronic pain syndrome   . Hypothyroid   . Psychiatric disorder   . CTS (carpal tunnel syndrome)   . Acne   . Eczema   . Edema   . Hyperlipidemia   . History of asthma   . Allergy   . GERD (gastroesophageal reflux disease)   . Depression   . Asthma   . Clotting disorder     factor V leiden   . Pulmonary embolus 2000  . Pulmonary emboli     post op tubal    Past Surgical History  Procedure Laterality Date  . Tubal ligation    . Bilateral thorascopic sympathetomies      for pain  . Tonsillectomy    . Orif femur fracture Left 01/14/2013    Procedure: OPEN REDUCTION INTERNAL FIXATION (ORIF) DISTAL FEMUR FRACTURE;  Surgeon: Budd Palmer, MD;  Location: [facility] OR;  Service: Orthopedics;  Laterality: Left;  . Upper gi endoscopy    . Colonoscopy      Family History  Problem Relation Age of Onset  . Cancer Sister     hx of breast cancer   Social History:  reports that she has been smoking Cigarettes.  She has a 14 pack-year smoking history. She has never used smokeless tobacco. She reports that  drinks alcohol. She reports that she does not use illicit  drugs.  Allergies:  Allergies  Allergen Reactions  . Alprazolam Other (See Comments)    "Hung over feeling"-can take now  . Elavil (Amitriptyline Hcl)     Hangover-can take now  . Eszopiclone Other (See Comments)    Unknown   . Flexeril (Cyclobenzaprine Hcl)   . Neurontin (Gabapentin) Other (See Comments)    hallucinations  . Other Other (See Comments)    Steroids by mouth or injections - psychosis  . Pregabalin     Blurred vision  . Soma (Carisoprodol) Nausea And Vomiting  . Ultracet (Tramadol-Acetaminophen) Nausea Only    Vision changes  . Ultram (Tramadol Hcl)     Visual changes  . Wellbutrin (Bupropion Hcl)     anxious  . Zolpidem Tartrate     No prescriptions prior to admission    No results found for this or any previous visit (from the past 48 hour(s)). No results found.  Review of Systems  All other systems reviewed and are negative.    Height 5\' 1"  (1.549 m), weight 58.968 kg (130 lb). Physical Exam  Constitutional:  WD, WN, NAD HEENT:  NCAT, EOMI Neuro/Psych:  Alert & oriented to person, place, and  time; appropriate mood & affect Lymphatic: No generalized UE edema or lymphadenopathy Extremities / MSK:  Both UE are normal with respect to appearance, ranges of motion, joint stability, muscle strength/tone, sensation, & perfusion except as otherwise noted:  The left thumb IP joint is thicker than one might expect with poor motion.  Labs / Xrays:  3 views of the left thumb ordered and obtained today reveals dorsal dislocation of the IP joint  Assessment: Left thumb IP joint dislocation, with redislocation following initial successful closed reduction and splinting.  Plan:  I discussed these findings with the patient.  I indicated that the joint was have to be reduced in the operating room and likely pinned as well.  She will need to stop Coumadin and go on a Lovenox bridge given her previous history of PE.  We will plan her surgery for early next  week.  Brianne Maina A. 02/01/2013, 6:21 AM

## 2013-02-01 NOTE — Anesthesia Preprocedure Evaluation (Signed)
Anesthesia Evaluation  Patient identified by MRN, date of birth, ID band Patient awake    Reviewed: Allergy & Precautions, H&P , NPO status , Patient's Chart, lab work & pertinent test results, reviewed documented beta blocker date and time   Airway Mallampati: II TM Distance: >3 FB Neck ROM: full    Dental   Pulmonary asthma ,  breath sounds clear to auscultation        Cardiovascular + Peripheral Vascular Disease Rhythm:regular     Neuro/Psych PSYCHIATRIC DISORDERS  Neuromuscular disease    GI/Hepatic Neg liver ROS, GERD-  Medicated and Controlled,  Endo/Other  Hypothyroidism   Renal/GU negative Renal ROS  negative genitourinary   Musculoskeletal   Abdominal   Peds  Hematology negative hematology ROS (+)   Anesthesia Other Findings See surgeon's H&P   Reproductive/Obstetrics negative OB ROS                           Anesthesia Physical Anesthesia Plan  ASA: II  Anesthesia Plan: General   Post-op Pain Management:    Induction: Intravenous  Airway Management Planned: LMA  Additional Equipment:   Intra-op Plan:   Post-operative Plan:   Informed Consent: I have reviewed the patients History and Physical, chart, labs and discussed the procedure including the risks, benefits and alternatives for the proposed anesthesia with the patient or authorized representative who has indicated his/her understanding and acceptance.   Dental Advisory Given  Plan Discussed with: CRNA and Surgeon  Anesthesia Plan Comments:         Anesthesia Quick Evaluation

## 2013-02-01 NOTE — Anesthesia Postprocedure Evaluation (Signed)
Anesthesia Post Note  Patient: Morgan Kelly  Procedure(s) Performed: Procedure(s) (LRB): OPEN REDUCTION, PERCUTANEOUS PINNING (Left)  Anesthesia type: General  Patient location: PACU  Post pain: Pain level controlled  Post assessment: Patient's Cardiovascular Status Stable  Last Vitals:  Filed Vitals:   02/01/13 1145  BP: 93/53  Pulse: 57  Temp:   Resp: 13    Post vital signs: Reviewed and stable  Level of consciousness: alert  Complications: No apparent anesthesia complications

## 2013-02-01 NOTE — Interval H&P Note (Signed)
History and Physical Interval Note:  02/01/2013 9:02 AM  Morgan Kelly  has presented today for surgery, with the diagnosis of LEFT THUMB INTERPHALANGEAL JOINT DISLOCATION  The various methods of treatment have been discussed with the patient and family. After consideration of risks, benefits and other options for treatment, the patient has consented to  Procedure(s): CLOSED REDUCTION FINGER WITH POSSIBLE PERCUTANEOUS PINNING (Left) as a surgical intervention .  The patient's history has been reviewed, patient examined, no change in status, stable for surgery.  I have reviewed the patient's chart and labs.  Questions were answered to the patient's satisfaction.     Shaman Muscarella A.

## 2013-02-02 ENCOUNTER — Encounter (HOSPITAL_BASED_OUTPATIENT_CLINIC_OR_DEPARTMENT_OTHER): Payer: Self-pay | Admitting: Orthopedic Surgery

## 2013-02-03 ENCOUNTER — Telehealth: Payer: Self-pay

## 2013-02-04 NOTE — Telephone Encounter (Signed)
Patient called ask that Dr Merla Riches call a Dr Carola Frost in regards to her. (Patient did not have  Phone # for this doctor) Patient stated she just got out of hospital. If questions pt may be reached at [phone]

## 2013-02-04 NOTE — Telephone Encounter (Signed)
I think patient ended up cancelling surgery, she ran out of her pain medications. ( she took 30 day supply in 10days) she has been advised to go to behavioral health if she suffers any symptoms of withdrawal. Dr Carola Frost did not ask about the lovenox bridge when I discussed patient with him, he was very concerned about the amount of narcotics she had taken in such a short time. Dr Cleta Alberts also spoke to Dr Carola Frost.

## 2013-02-04 NOTE — Telephone Encounter (Signed)
What Happened with all of this--we should not have had to be involved in the Lovenox bridge or in pain management during her surgery

## 2013-02-05 ENCOUNTER — Telehealth: Payer: Self-pay

## 2013-02-05 NOTE — Telephone Encounter (Signed)
Patient called in to let Dr. Merla Riches know she has not been able to complete her therapy due to the pain and she needs medication for her pain.  She mentioned having surgery on her hand on Monday.  She can be reached at [phone] and if you cannot get her her sister's number is [phone].

## 2013-02-05 NOTE — Telephone Encounter (Signed)
I am sure it is in regards to her pain medications. We will call Dr Carola Frost today.

## 2013-02-05 NOTE — Telephone Encounter (Signed)
Call her sis Sat am---pain meds ---see notes /she overused--we can not prescribe more The ortho docs will need to arrange anything else We called them today(Fri)

## 2013-02-05 NOTE — Telephone Encounter (Signed)
Patient called sounded inaudible but she says Dr. Merla Riches needs to call back now. She didn't say what it was for. I told her he is still seeing patients and she will have to wait.   Didn't give a phone number?

## 2013-02-08 NOTE — Telephone Encounter (Signed)
Spoke w/sister and gave her message. She stated that Dr Carola Frost will not Rx the pain med, he says that he wants the PCP to do that. I spoke w/sister about possible referral to pain clinic. She stated that she will ask pt about that and be back in touch if she wants a referral, but she thinks she has spoken w/Dr Merla Riches about this in the past. Dr Merla Riches, [place]

## 2013-02-09 NOTE — Telephone Encounter (Signed)
FYI

## 2013-02-17 ENCOUNTER — Inpatient Hospital Stay (HOSPITAL_COMMUNITY)
Admission: EM | Admit: 2013-02-17 | Discharge: 2013-02-18 | DRG: 101 | Disposition: A | Discharge status: Home / Self Care | Payer: Medicare Other | Attending: Family Medicine | Admitting: Family Medicine

## 2013-02-17 ENCOUNTER — Inpatient Hospital Stay (HOSPITAL_COMMUNITY): Payer: Medicare Other

## 2013-02-17 ENCOUNTER — Emergency Department (HOSPITAL_COMMUNITY): Payer: Medicare Other

## 2013-02-17 ENCOUNTER — Encounter (HOSPITAL_COMMUNITY): Payer: Self-pay | Admitting: Emergency Medicine

## 2013-02-17 DIAGNOSIS — S72409A Unspecified fracture of lower end of unspecified femur, initial encounter for closed fracture: Secondary | ICD-10-CM

## 2013-02-17 DIAGNOSIS — E785 Hyperlipidemia, unspecified: Secondary | ICD-10-CM | POA: Diagnosis present

## 2013-02-17 DIAGNOSIS — R569 Unspecified convulsions: Principal | ICD-10-CM

## 2013-02-17 DIAGNOSIS — R945 Abnormal results of liver function studies: Secondary | ICD-10-CM

## 2013-02-17 DIAGNOSIS — Z720 Tobacco use: Secondary | ICD-10-CM

## 2013-02-17 DIAGNOSIS — E876 Hypokalemia: Secondary | ICD-10-CM | POA: Diagnosis present

## 2013-02-17 DIAGNOSIS — E872 Acidosis, unspecified: Secondary | ICD-10-CM

## 2013-02-17 DIAGNOSIS — Z4789 Encounter for other orthopedic aftercare: Secondary | ICD-10-CM

## 2013-02-17 DIAGNOSIS — F339 Major depressive disorder, recurrent, unspecified: Secondary | ICD-10-CM

## 2013-02-17 DIAGNOSIS — E039 Hypothyroidism, unspecified: Secondary | ICD-10-CM | POA: Diagnosis present

## 2013-02-17 DIAGNOSIS — F411 Generalized anxiety disorder: Secondary | ICD-10-CM | POA: Diagnosis present

## 2013-02-17 DIAGNOSIS — M797 Fibromyalgia: Secondary | ICD-10-CM | POA: Diagnosis present

## 2013-02-17 DIAGNOSIS — S63105A Unspecified dislocation of left thumb, initial encounter: Secondary | ICD-10-CM

## 2013-02-17 DIAGNOSIS — Z66 Do not resuscitate: Secondary | ICD-10-CM | POA: Diagnosis not present

## 2013-02-17 DIAGNOSIS — D6859 Other primary thrombophilia: Secondary | ICD-10-CM | POA: Diagnosis present

## 2013-02-17 DIAGNOSIS — F4310 Post-traumatic stress disorder, unspecified: Secondary | ICD-10-CM | POA: Diagnosis present

## 2013-02-17 DIAGNOSIS — K589 Irritable bowel syndrome without diarrhea: Secondary | ICD-10-CM | POA: Diagnosis present

## 2013-02-17 DIAGNOSIS — M5030 Other cervical disc degeneration, unspecified cervical region: Secondary | ICD-10-CM

## 2013-02-17 DIAGNOSIS — S72402S Unspecified fracture of lower end of left femur, sequela: Secondary | ICD-10-CM

## 2013-02-17 DIAGNOSIS — IMO0001 Reserved for inherently not codable concepts without codable children: Secondary | ICD-10-CM | POA: Diagnosis present

## 2013-02-17 DIAGNOSIS — M47812 Spondylosis without myelopathy or radiculopathy, cervical region: Secondary | ICD-10-CM

## 2013-02-17 DIAGNOSIS — E871 Hypo-osmolality and hyponatremia: Secondary | ICD-10-CM

## 2013-02-17 DIAGNOSIS — L259 Unspecified contact dermatitis, unspecified cause: Secondary | ICD-10-CM | POA: Diagnosis present

## 2013-02-17 DIAGNOSIS — R7989 Other specified abnormal findings of blood chemistry: Secondary | ICD-10-CM

## 2013-02-17 DIAGNOSIS — K219 Gastro-esophageal reflux disease without esophagitis: Secondary | ICD-10-CM | POA: Diagnosis present

## 2013-02-17 DIAGNOSIS — J45909 Unspecified asthma, uncomplicated: Secondary | ICD-10-CM | POA: Diagnosis present

## 2013-02-17 DIAGNOSIS — Z86711 Personal history of pulmonary embolism: Secondary | ICD-10-CM

## 2013-02-17 DIAGNOSIS — F17200 Nicotine dependence, unspecified, uncomplicated: Secondary | ICD-10-CM

## 2013-02-17 DIAGNOSIS — G8929 Other chronic pain: Secondary | ICD-10-CM

## 2013-02-17 DIAGNOSIS — S8290XS Unspecified fracture of unspecified lower leg, sequela: Secondary | ICD-10-CM

## 2013-02-17 DIAGNOSIS — S63105S Unspecified dislocation of left thumb, sequela: Secondary | ICD-10-CM

## 2013-02-17 DIAGNOSIS — F39 Unspecified mood [affective] disorder: Secondary | ICD-10-CM

## 2013-02-17 LAB — BASIC METABOLIC PANEL
BUN: 6 mg/dL (ref 6–23)
BUN: 3 mg/dL — ABNORMAL LOW (ref 6–23)
CO2: 18 mEq/L — ABNORMAL LOW (ref 19–32)
CO2: 21 mEq/L (ref 19–32)
Calcium: 8.9 mg/dL (ref 8.4–10.5)
Calcium: 8.8 mg/dL (ref 8.4–10.5)
Chloride: 90 mEq/L — ABNORMAL LOW (ref 96–112)
Chloride: 101 mEq/L (ref 96–112)
Creatinine, Ser: 0.57 mg/dL (ref 0.50–1.10)
GFR calc Af Amer: >90 mL/min (ref >90)
GFR calc Af Amer: >90 mL/min (ref >90)
GFR calc Af Amer: >90 mL/min (ref >90)
GFR calc non Af Amer: >90 mL/min (ref >90)
GFR calc non Af Amer: >90 mL/min (ref >90)
GFR calc non Af Amer: >90 mL/min (ref >90)
Glucose, Bld: 103 mg/dL — ABNORMAL HIGH (ref 70–99)
Potassium: 4.3 mEq/L (ref 3.5–5.1)
Potassium: 3.7 mEq/L (ref 3.5–5.1)
Potassium: 3 mEq/L — ABNORMAL LOW (ref 3.5–5.1)
Sodium: 122 mEq/L — ABNORMAL LOW (ref 135–145)
Sodium: 133 mEq/L — ABNORMAL LOW (ref 135–145)
Sodium: 135 mEq/L (ref 135–145)
Sodium: 133 mEq/L — ABNORMAL LOW (ref 135–145)

## 2013-02-17 LAB — HEPATIC FUNCTION PANEL
ALT: 11 U/L (ref 0–35)
AST: 19 U/L (ref 0–37)
Alkaline Phosphatase: 141 U/L — ABNORMAL HIGH (ref 39–117)
Bilirubin, Direct: <0.1 mg/dL (ref 0.0–0.3)

## 2013-02-17 LAB — CBC
HCT: 35.7 % — ABNORMAL LOW (ref 36.0–46.0)
Hemoglobin: 12.8 g/dL (ref 12.0–15.0)
MCH: 33.2 pg (ref 26.0–34.0)
MCHC: 35.9 g/dL (ref 30.0–36.0)
RBC: 3.85 MIL/uL — ABNORMAL LOW (ref 3.87–5.11)

## 2013-02-17 LAB — RAPID URINE DRUG SCREEN, HOSP PERFORMED
Barbiturates: POSITIVE — AB
Benzodiazepines: NOT DETECTED
Cocaine: NOT DETECTED
Tetrahydrocannabinol: NOT DETECTED

## 2013-02-17 LAB — GLUCOSE, CAPILLARY: Glucose-Capillary: 116 mg/dL — ABNORMAL HIGH (ref 70–99)

## 2013-02-17 MED ORDER — SODIUM CHLORIDE 0.9 % IV SOLN
INTRAVENOUS | Status: DC
  Administered 2013-02-18: 02:00:00 via INTRAVENOUS

## 2013-02-17 MED ORDER — CITALOPRAM HYDROBROMIDE 40 MG PO TABS
40.0000 mg | ORAL_TABLET | Freq: Every day | ORAL | Status: DC
  Administered 2013-02-18: 40 mg via ORAL
  Filled 2013-02-17 (×2): qty 1

## 2013-02-17 MED ORDER — SODIUM CHLORIDE 0.9 % IJ SOLN
3.0000 mL | Freq: Two times a day (BID) | INTRAMUSCULAR | Status: DC
  Administered 2013-02-17: 3 mL via INTRAVENOUS

## 2013-02-17 MED ORDER — ACETAMINOPHEN 650 MG RE SUPP: 650.0000 mg | Freq: Four times a day (QID) | RECTAL | Status: DC | PRN

## 2013-02-17 MED ORDER — LORATADINE 10 MG PO TABS
10.0000 mg | ORAL_TABLET | Freq: Every day | ORAL | Status: DC | PRN
  Filled 2013-02-17: qty 1

## 2013-02-17 MED ORDER — OXYCODONE-ACETAMINOPHEN 5-325 MG PO TABS
1.0000 | ORAL_TABLET | Freq: Once | ORAL | Status: AC
  Administered 2013-02-18: 1 via ORAL
  Filled 2013-02-17 (×2): qty 1

## 2013-02-17 MED ORDER — SODIUM CHLORIDE 0.9 % IV SOLN
500.0000 mg | Freq: Two times a day (BID) | INTRAVENOUS | Status: DC
  Administered 2013-02-17: 500 mg via INTRAVENOUS
  Filled 2013-02-17 (×2): qty 5

## 2013-02-17 MED ORDER — LORAZEPAM 1 MG PO TABS: 1.0000 mg | ORAL_TABLET | Freq: Four times a day (QID) | ORAL | Status: DC | PRN

## 2013-02-17 MED ORDER — FERROUS SULFATE 325 (65 FE) MG PO TABS
325.0000 mg | ORAL_TABLET | Freq: Three times a day (TID) | ORAL | Status: DC
  Administered 2013-02-18 (×3): 325 mg via ORAL
  Filled 2013-02-17 (×7): qty 1

## 2013-02-17 MED ORDER — METHOCARBAMOL 500 MG PO TABS
500.0000 mg | ORAL_TABLET | Freq: Four times a day (QID) | ORAL | Status: DC | PRN
  Administered 2013-02-18: 1000 mg via ORAL
  Filled 2013-02-17: qty 2

## 2013-02-17 MED ORDER — ENOXAPARIN SODIUM 40 MG/0.4ML ~~LOC~~ SOLN
40.0000 mg | SUBCUTANEOUS | Status: DC
  Administered 2013-02-18: 40 mg via SUBCUTANEOUS
  Filled 2013-02-17 (×2): qty 0.4

## 2013-02-17 MED ORDER — LORAZEPAM 2 MG/ML IJ SOLN: 1.0000 mg | Freq: Four times a day (QID) | INTRAMUSCULAR | Status: DC | PRN

## 2013-02-17 MED ORDER — LORAZEPAM 2 MG/ML IJ SOLN
1.0000 mg | Freq: Four times a day (QID) | INTRAMUSCULAR | Status: DC | PRN
  Administered 2013-02-17: 1 mg via INTRAVENOUS
  Filled 2013-02-17: qty 1

## 2013-02-17 MED ORDER — ALPRAZOLAM 0.5 MG PO TABS
2.0000 mg | ORAL_TABLET | Freq: Three times a day (TID) | ORAL | Status: DC | PRN
  Administered 2013-02-18: 1 mg via ORAL
  Administered 2013-02-18: 0.5 mg via ORAL
  Administered 2013-02-18: 1.5 mg via ORAL
  Filled 2013-02-17 (×2): qty 4
  Filled 2013-02-17: qty 1
  Filled 2013-02-17: qty 3

## 2013-02-17 MED ORDER — ONDANSETRON HCL 4 MG PO TABS: 4.0000 mg | ORAL_TABLET | Freq: Four times a day (QID) | ORAL | Status: DC | PRN

## 2013-02-17 MED ORDER — IBUPROFEN 600 MG PO TABS
600.0000 mg | ORAL_TABLET | Freq: Four times a day (QID) | ORAL | Status: DC | PRN
  Filled 2013-02-17: qty 1

## 2013-02-17 MED ORDER — SODIUM CHLORIDE 0.9 % IV SOLN
1000.0000 mL | INTRAVENOUS | Status: DC
  Administered 2013-02-17: 1000 mL via INTRAVENOUS

## 2013-02-17 MED ORDER — ALBUTEROL SULFATE HFA 108 (90 BASE) MCG/ACT IN AERS: 1.0000 | INHALATION_SPRAY | Freq: Four times a day (QID) | RESPIRATORY_TRACT | Status: DC | PRN

## 2013-02-17 MED ORDER — LEVOTHYROXINE SODIUM 200 MCG PO TABS
200.0000 ug | ORAL_TABLET | Freq: Every day | ORAL | Status: DC
  Administered 2013-02-18: 200 ug via ORAL
  Filled 2013-02-17 (×3): qty 1

## 2013-02-17 MED ORDER — ADULT MULTIVITAMIN W/MINERALS CH
1.0000 | ORAL_TABLET | Freq: Every day | ORAL | Status: DC
  Administered 2013-02-18: 1 via ORAL
  Filled 2013-02-17 (×2): qty 1

## 2013-02-17 MED ORDER — FOLIC ACID 1 MG PO TABS
1.0000 mg | ORAL_TABLET | Freq: Every day | ORAL | Status: DC
  Administered 2013-02-18: 1 mg via ORAL
  Filled 2013-02-17 (×2): qty 1

## 2013-02-17 MED ORDER — ONDANSETRON HCL 4 MG/2ML IJ SOLN: 4.0000 mg | Freq: Four times a day (QID) | INTRAMUSCULAR | Status: DC | PRN

## 2013-02-17 MED ORDER — THIAMINE HCL 100 MG/ML IJ SOLN
100.0000 mg | Freq: Every day | INTRAMUSCULAR | Status: DC
  Filled 2013-02-17 (×2): qty 1

## 2013-02-17 MED ORDER — AMITRIPTYLINE HCL 50 MG PO TABS
50.0000 mg | ORAL_TABLET | Freq: Every evening | ORAL | Status: DC | PRN
  Filled 2013-02-17: qty 2

## 2013-02-17 MED ORDER — LORAZEPAM 2 MG/ML IJ SOLN: 0.0000 mg | Freq: Two times a day (BID) | INTRAMUSCULAR | Status: DC

## 2013-02-17 MED ORDER — MONTELUKAST SODIUM 10 MG PO TABS
10.0000 mg | ORAL_TABLET | Freq: Every day | ORAL | Status: DC
  Administered 2013-02-17: 10 mg via ORAL
  Filled 2013-02-17 (×2): qty 1

## 2013-02-17 MED ORDER — LOPERAMIDE HCL 2 MG PO CAPS
4.0000 mg | ORAL_CAPSULE | Freq: Once | ORAL | Status: DC
  Filled 2013-02-17: qty 2

## 2013-02-17 MED ORDER — SODIUM CHLORIDE 0.9 % IV SOLN
1000.0000 mL | Freq: Once | INTRAVENOUS | Status: AC
  Administered 2013-02-17: 1000 mL via INTRAVENOUS

## 2013-02-17 MED ORDER — ONDANSETRON HCL 4 MG/2ML IJ SOLN
4.0000 mg | Freq: Once | INTRAMUSCULAR | Status: AC
  Administered 2013-02-17: 4 mg via INTRAVENOUS
  Filled 2013-02-17: qty 2

## 2013-02-17 MED ORDER — VITAMIN B-1 100 MG PO TABS
100.0000 mg | ORAL_TABLET | Freq: Every day | ORAL | Status: DC
  Administered 2013-02-18: 100 mg via ORAL
  Filled 2013-02-17 (×2): qty 1

## 2013-02-17 MED ORDER — SODIUM CHLORIDE 0.9 % IV SOLN
1000.0000 mg | Freq: Once | INTRAVENOUS | Status: AC
  Administered 2013-02-17: 1000 mg via INTRAVENOUS
  Filled 2013-02-17: qty 10

## 2013-02-17 MED ORDER — LORAZEPAM 2 MG/ML IJ SOLN
0.0000 mg | Freq: Four times a day (QID) | INTRAMUSCULAR | Status: DC
  Administered 2013-02-17 (×2): 1 mg via INTRAVENOUS
  Administered 2013-02-18 (×2): 2 mg via INTRAVENOUS
  Filled 2013-02-17 (×4): qty 1

## 2013-02-17 MED ORDER — MOMETASONE FURO-FORMOTEROL FUM 100-5 MCG/ACT IN AERO
2.0000 | INHALATION_SPRAY | Freq: Two times a day (BID) | RESPIRATORY_TRACT | Status: DC
  Administered 2013-02-17 – 2013-02-18 (×2): 2 via RESPIRATORY_TRACT
  Filled 2013-02-17: qty 8.8

## 2013-02-17 MED ORDER — ACETAMINOPHEN 325 MG PO TABS
650.0000 mg | ORAL_TABLET | Freq: Four times a day (QID) | ORAL | Status: DC | PRN
  Filled 2013-02-17: qty 2

## 2013-02-17 NOTE — Progress Notes (Signed)
[facility]  Patient Status: Active (receiving services up to time of hospitalization)  [facility] is providing the following services: RN and PT  If patient discharges after hours, please call [phone].   Morgan Kelly 02/17/2013, 10:27 AM

## 2013-02-17 NOTE — Consult Note (Signed)
NEURO HOSPITALIST CONSULT NOTE    Reason for Consult: Seizure  HPI:                                                                                                                                          Morgan Kelly is an 62 y.o. female who was brought to the ED after having two witnessed seizures.  Currently patient is awake and oriented but refusing to answer all questions and take part in formal exam. What I can obtain is that she was at home last night and had diarrhea and emesis. Sister went to house and witness first event described as arms flexed to chest and legs held stiff for 45 seconds and 15 minute post ictal phase.  A second seizure occurred about 3 hours later --with similar appearance. Patient was brought to ED.  Sister does state patient drinks multiple beers daily but has not been without ETOH (in fact had beer night of event). No history of seizure or seizure disorder in family.  Patient is a chronic pain patient.  Patient did admit to not having her Xanax for about 2 days but states she only takes ".25 mg" every night.   Past Medical History  Diagnosis Date  . Fibromyalgia   . Chronic pain syndrome   . Hypothyroid   . Psychiatric disorder   . CTS (carpal tunnel syndrome)   . Acne   . Eczema   . Edema   . Hyperlipidemia   . History of asthma   . Allergy   . GERD (gastroesophageal reflux disease)   . Depression   . Asthma   . Clotting disorder     factor V leiden   . Pulmonary embolus 2000  . Pulmonary emboli     post op tubal    Past Surgical History  Procedure Laterality Date  . Tubal ligation    . Bilateral thorascopic sympathetomies      for pain  . Tonsillectomy    . Orif femur fracture Left 01/14/2013    Procedure: OPEN REDUCTION INTERNAL FIXATION (ORIF) DISTAL FEMUR FRACTURE;  Surgeon: Budd Palmer, MD;  Location: [facility] OR;  Service: Orthopedics;  Laterality: Left;  . Upper gi endoscopy    . Colonoscopy    . Closed  reduction finger with percutaneous pinning Left 02/01/2013    Procedure: OPEN REDUCTION, PERCUTANEOUS PINNING;  Surgeon: Jodi Marble, MD;  Location: [facility];  Service: Orthopedics;  Laterality: Left;    Family History  Problem Relation Age of Onset  . Cancer Sister     hx of breast cancer     Social History:  reports that she has been smoking Cigarettes.  She has a 14 pack-year smoking history. She has never used smokeless tobacco.  She reports that  drinks alcohol. She reports that she does not use illicit drugs.  Allergies  Allergen Reactions  . Alprazolam Other (See Comments)    "Hung over feeling"-can take now  . Elavil [Amitriptyline Hcl]     Hangover-can take now  . Eszopiclone Other (See Comments)    Unknown   . Flexeril [Cyclobenzaprine Hcl]   . Neurontin [Gabapentin] Other (See Comments)    hallucinations  . Other Other (See Comments)    Steroids by mouth or injections - psychosis  . Pregabalin     Blurred vision  . Soma [Carisoprodol] Nausea And Vomiting  . Ultracet [Tramadol-Acetaminophen] Nausea Only    Vision changes  . Ultram [Tramadol Hcl]     Visual changes  . Wellbutrin [Bupropion Hcl]     anxious  . Zolpidem Tartrate     MEDICATIONS:                                                                                                                     Prior to Admission:  Prescriptions prior to admission  Medication Sig Dispense Refill  . acetaminophen (TYLENOL) 500 MG tablet Take 1-2 tablets (500-1,000 mg total) by mouth every 6 (six) hours as needed for pain or fever.  30 tablet  0  . albuterol (PROVENTIL HFA;VENTOLIN HFA) 108 (90 BASE) MCG/ACT inhaler Inhale 1-2 puffs into the lungs every 6 (six) hours as needed for wheezing or shortness of breath.      . alprazolam (XANAX) 2 MG tablet Take 1 tablet (2 mg total) by mouth 3 (three) times daily as needed for anxiety.  90 tablet  2  . amitriptyline (ELAVIL) 50 MG tablet Take 50-100 mg by  mouth at bedtime as needed for sleep.       . belladonna alk-PHENObarbital (DONNATAL) 16.2 MG tablet Take 1 tablet by mouth every 8 (eight) hours as needed.       . citalopram (CELEXA) 40 MG tablet Take 1 tablet (40 mg total) by mouth daily.  90 tablet  3  . clindamycin (CLEOCIN T) 1 % lotion Apply 1 application topically daily as needed. Apply sparingly and massage in.      . diphenoxylate-atropine (LOMOTIL) 2.5-0.025 MG per tablet Take 1 tablet by mouth 4 (four) times daily as needed. diarrhea  30 tablet  0  . docusate sodium 100 MG CAPS Take 100 mg by mouth 2 (two) times daily.  10 capsule  0  . enoxaparin (LOVENOX) 40 MG/0.4ML injection Inject 0.4 mLs (40 mg total) into the skin daily.  14 Syringe  0  . ferrous sulfate 325 (65 FE) MG tablet Take 1 tablet (325 mg total) by mouth 3 (three) times daily after meals.  90 tablet  0  . Fluticasone-Salmeterol (ADVAIR) 250-50 MCG/DOSE AEPB Inhale 1 puff into the lungs every 12 (twelve) hours.  60 each  5  . furosemide (LASIX) 20 MG tablet Take 20 mg by mouth See admin instructions. Take every three days as needed for  swelling/edema.      Marland Kitchen levothyroxine (SYNTHROID, LEVOTHROID) 200 MCG tablet Take 1 tablet (200 mcg total) by mouth daily.  90 tablet  3  . lidocaine (LIDODERM) 5 % Place 3 patches onto the skin daily. Remove & Discard patch within 12 hours or as directed by MD  90 patch  5  . loratadine (CLARITIN) 10 MG tablet Take 10 mg by mouth daily as needed for allergies.       . Magnesium Hydroxide (MILK OF MAGNESIA PO) Take 5-10 mLs by mouth daily. Constipation      . methocarbamol (ROBAXIN) 500 MG tablet Take 1-2 tablets (500-1,000 mg total) by mouth 4 (four) times daily as needed (spasms).  80 tablet  0  . montelukast (SINGULAIR) 10 MG tablet Take 10 mg by mouth at bedtime.      Marland Kitchen oxycodone (ROXICODONE) 30 MG immediate release tablet Take 1 tablet (30 mg total) by mouth 2 (two) times daily. For 01/2713 or after  60 tablet  0  . Oxycodone HCl 20 MG  TABS Take 1 tablet (20 mg total) by mouth every 6 (six) hours as needed. For 02/17/13 or after  120 tablet  0  . oxyCODONE-acetaminophen (PERCOCET) 10-325 MG per tablet Take 1 tablet by mouth every 4 (four) hours as needed for pain.  40 tablet  0  . spironolactone (ALDACTONE) 100 MG tablet Take 100 mg by mouth daily as needed (to control acne).        Scheduled: . citalopram  40 mg Oral Daily  . enoxaparin  40 mg Subcutaneous Q24H  . ferrous sulfate  325 mg Oral TID PC  . folic acid  1 mg Oral Daily  . levothyroxine  200 mcg Oral QAC breakfast  . LORazepam  0-4 mg Intravenous Q6H   Followed by  . [START ON 02/19/2013] LORazepam  0-4 mg Intravenous Q12H  . mometasone-formoterol  2 puff Inhalation BID  . montelukast  10 mg Oral QHS  . multivitamin with minerals  1 tablet Oral Daily  . sodium chloride  3 mL Intravenous Q12H  . thiamine  100 mg Oral Daily   Or  . thiamine  100 mg Intravenous Daily     ROS:                                                                                                                                       History obtained from the patient  General ROS: negative for - chills, fatigue, fever, night sweats, weight gain or weight loss Psychological ROS: negative for - behavioral disorder, hallucinations, memory difficulties, mood swings or suicidal ideation Ophthalmic ROS: negative for - blurry vision, double vision, eye pain or loss of vision ENT ROS: negative for - epistaxis, nasal discharge, oral lesions, sore throat, tinnitus or vertigo Allergy and Immunology ROS: negative for - hives or itchy/watery eyes Hematological and Lymphatic ROS: negative for - bleeding  problems, bruising or swollen lymph nodes Endocrine ROS: negative for - galactorrhea, hair pattern changes, polydipsia/polyuria or temperature intolerance Respiratory ROS: negative for - cough, hemoptysis, shortness of breath or wheezing Cardiovascular ROS: negative for - chest pain, dyspnea on  exertion, edema or irregular heartbeat Gastrointestinal ROS: negative for - abdominal pain, diarrhea, hematemesis, nausea/vomiting or stool incontinence Genito-Urinary ROS: negative for - dysuria, hematuria, incontinence or urinary frequency/urgency Musculoskeletal ROS: negative for - joint swelling or muscular weakness Neurological ROS: as noted in HPI Dermatological ROS: negative for rash and skin lesion changes   Blood pressure 134/70, pulse 82, temperature 98.7 F (37.1 C), temperature source Axillary, resp. rate 19, height 5\' 1"  (1.549 m), weight 59 kg (130 lb 1.1 oz), SpO2 96.00%.   Neurologic Examination:                                                                                                      Mental Status: Alert, oriented, thought content appropriate.  Speech fluent without evidence of aphasia.  Able to follow 3 step commands without difficulty. Cranial Nerves: II: Discs flat bilaterally; Visual fields grossly normal, pupils equal, round, reactive to light and accommodation III,IV, VI: ptosis not present, extra-ocular motions intact bilaterally V,VII: smile symmetric, facial light touch sensation normal bilaterally VIII: hearing normal bilaterally IX,X: gag reflex present XI: bilateral shoulder shrug XII: midline tongue extension Motor: Right : Upper extremity   5/5    Left:     Upper extremity   5/5  Lower extremity   5/5     Lower extremity   5/5 Tone and bulk:normal tone throughout; no atrophy noted Sensory: Pinprick and light touch intact throughout, bilaterally Deep Tendon Reflexes:  Right: Upper Extremity   Left: Upper extremity   biceps (C-5 to C-6) 2/4   biceps (C-5 to C-6) 2/4 tricep (C7) 2/4    triceps (C7) 2/4 Brachioradialis (C6) 2/4  Brachioradialis (C6) 2/4  Lower Extremity Lower Extremity  quadriceps (L-2 to L-4) 2/4   quadriceps (L-2 to L-4) 2/4 Achilles (S1) 2/4   Achilles (S1) 2/4  Plantars: Right: downgoing   Left:  downgoing Cerebellar: normal finger-to-nose,  normal heel-to-shin test Gait: not able to test due to weight baring limitation.  CV: pulses palpable throughout    Lab Results  Component Value Date/Time   CHOL 196 06/12/2011    Results for orders placed during the hospital encounter of 02/17/13 (from the past 48 hour(s))  GLUCOSE, CAPILLARY     Status: Abnormal   Collection Time    02/17/13  1:27 AM      Result Value Range   Glucose-Capillary 116 (*) 70 - 99 mg/dL  BASIC METABOLIC PANEL     Status: Abnormal   Collection Time    02/17/13  1:45 AM      Result Value Range   Sodium 122 (*) 135 - 145 mEq/L   Potassium 4.3  3.5 - 5.1 mEq/L   Comment: HEMOLYSIS AT THIS LEVEL MAY AFFECT RESULT   Chloride 90 (*) 96 - 112 mEq/L   CO2 18 (*) 19 -  32 mEq/L   Glucose, Bld 103 (*) 70 - 99 mg/dL   BUN 6  6 - 23 mg/dL   Creatinine, Ser 2.53  0.50 - 1.10 mg/dL   Calcium 9.2  8.4 - 66.4 mg/dL   GFR calc non Af Amer >90  >90 mL/min   GFR calc Af Amer >90  >90 mL/min   Comment: (NOTE)     The eGFR has been calculated using the CKD EPI equation.     This calculation has not been validated in all clinical situations.     eGFR's persistently <90 mL/min signify possible Chronic Kidney     Disease.  CBC     Status: Abnormal   Collection Time    02/17/13  1:45 AM      Result Value Range   WBC 7.1  4.0 - 10.5 K/uL   RBC 3.85 (*) 3.87 - 5.11 MIL/uL   Hemoglobin 12.8  12.0 - 15.0 g/dL   HCT 40.3 (*) 47.4 - 25.9 %   MCV 92.7  78.0 - 100.0 fL   MCH 33.2  26.0 - 34.0 pg   MCHC 35.9  30.0 - 36.0 g/dL   RDW 56.3  87.5 - 64.3 %   Platelets 294  150 - 400 K/uL  HEPATIC FUNCTION PANEL     Status: Abnormal   Collection Time    02/17/13  3:30 AM      Result Value Range   Total Protein 7.3  6.0 - 8.3 g/dL   Albumin 3.9  3.5 - 5.2 g/dL   AST 19  0 - 37 U/L   ALT 11  0 - 35 U/L   Alkaline Phosphatase 141 (*) 39 - 117 U/L   Total Bilirubin 0.4  0.3 - 1.2 mg/dL   Bilirubin, Direct <3.2  0.0 - 0.3  mg/dL   Indirect Bilirubin NOT CALCULATED  0.3 - 0.9 mg/dL  ETHANOL     Status: None   Collection Time    02/17/13  3:30 AM      Result Value Range   Alcohol, Ethyl (B) <11  0 - 11 mg/dL   Comment:            LOWEST DETECTABLE LIMIT FOR     SERUM ALCOHOL IS 11 mg/dL     FOR MEDICAL PURPOSES ONLY  URINE RAPID DRUG SCREEN (HOSP PERFORMED)     Status: Abnormal   Collection Time    02/17/13  4:17 AM      Result Value Range   Opiates NONE DETECTED  NONE DETECTED   Cocaine NONE DETECTED  NONE DETECTED   Benzodiazepines NONE DETECTED  NONE DETECTED   Amphetamines NONE DETECTED  NONE DETECTED   Tetrahydrocannabinol NONE DETECTED  NONE DETECTED   Barbiturates POSITIVE (*) NONE DETECTED   Comment:            DRUG SCREEN FOR MEDICAL PURPOSES     ONLY.  IF CONFIRMATION IS NEEDED     FOR ANY PURPOSE, NOTIFY LAB     WITHIN 5 DAYS.                LOWEST DETECTABLE LIMITS     FOR URINE DRUG SCREEN     Drug Class       Cutoff (ng/mL)     Amphetamine      1000     Barbiturate      200     Benzodiazepine   200     Tricyclics  300     Opiates          300     Cocaine          300     THC              50  BASIC METABOLIC PANEL     Status: Abnormal   Collection Time    02/17/13  9:00 AM      Result Value Range   Sodium 133 (*) 135 - 145 mEq/L   Potassium 3.7  3.5 - 5.1 mEq/L   Chloride 100  96 - 112 mEq/L   CO2 21  19 - 32 mEq/L   Glucose, Bld 94  70 - 99 mg/dL   BUN 5 (*) 6 - 23 mg/dL   Creatinine, Ser 1.61  0.50 - 1.10 mg/dL   Calcium 8.9  8.4 - 09.6 mg/dL   GFR calc non Af Amer >90  >90 mL/min   GFR calc Af Amer >90  >90 mL/min   Comment: (NOTE)     The eGFR has been calculated using the CKD EPI equation.     This calculation has not been validated in all clinical situations.     eGFR's persistently <90 mL/min signify possible Chronic Kidney     Disease.  BASIC METABOLIC PANEL     Status: Abnormal   Collection Time    02/17/13  1:58 PM      Result Value Range   Sodium  135  135 - 145 mEq/L   Potassium 3.7  3.5 - 5.1 mEq/L   Chloride 103  96 - 112 mEq/L   CO2 22  19 - 32 mEq/L   Glucose, Bld 94  70 - 99 mg/dL   BUN 4 (*) 6 - 23 mg/dL   Creatinine, Ser 0.45  0.50 - 1.10 mg/dL   Calcium 8.8  8.4 - 40.9 mg/dL   GFR calc non Af Amer >90  >90 mL/min   GFR calc Af Amer >90  >90 mL/min   Comment: (NOTE)     The eGFR has been calculated using the CKD EPI equation.     This calculation has not been validated in all clinical situations.     eGFR's persistently <90 mL/min signify possible Chronic Kidney     Disease.    Ct Head Wo Contrast  02/17/2013   *RADIOLOGY REPORT*  Clinical Data: New onset seizure  CT HEAD WITHOUT CONTRAST  Technique:  Contiguous axial images were obtained from the base of the skull through the vertex without contrast.  Comparison: 01/11/2013  Findings: Subtle hypoattenuation right temporal lobe (image 13 series 2). No intraparenchymal hemorrhage, mass, mass effect, or abnormal extra-axial fluid collection. The visualized paranasal sinuses and mastoid air cells are predominately clear.  No displaced calvarial fracture.  IMPRESSION: Subtle hypoattenuation right temporal lobe may be artifact. However, given the stated history, MRI is recommended to exclude infection (such as HSV encephalitis), early infarct, or edema in the setting of recent seizure.  Discussed via telephone with Dr. Preston Fleeting at 04:40 a.m. on 02/17/2013.   Original Report Authenticated By: Jearld Lesch, M.D.    Felicie Morn PA-C [facility] [phone]  02/17/2013, 2:59 PM  Patient seen and examined.  Clinical course and management discussed.  Necessary edits performed.  I agree with the above.  Assessment and plan of care developed and discussed below.    Assessment/Plan: 62 years old female presenting with new onset seizures.  Patient a poor historian  and changes her story frequently.  It is unclear at this point if the seizures were provoked.  The patient is a  drinker and takes Xanax which she may or may not have been taking recently.  Is very concerned at this time to the point of threatening to check out AMA that she will not be treated appropriately for her pain.  Lab work shows metabolic abnormalities as well.    Recommendations: 1.  MRI of the brain with and without contrast 2.  Would not continue maintenance Keppra at this time 3.  EEG 4.  Seizure precautions 5.  Agree with correction of metabolic abnormalities.     Thana Farr, MD [facility] [phone]  02/17/2013  5:51 PM
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
## 2013-02-17 NOTE — ED Notes (Addendum)
Pt not given Immodium at this time, pt sleeping and does not want to take her medication by mouth. Pt asked to wake up to take medicine and pt stated "No"
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
## 2013-02-17 NOTE — H&P (Signed)
I have seen and examined this patient. I have discussed with Dr Casper Harrison.  I agree with their findings and plans as documented in their admission note.  Acute Issues  Seizure Event (New problem) - Differential etiology    Benzodiazipine withdrawal (no benzo's in UDS though prescribed Xanax), Opiate withdrawal (no opiates in UDS though prescribed oxycodone), Hypovolemic Hyponatremia, Acute CNS process (possible right temporal lobe hypoattenuation lesion Vs artifact)  Recommendation Brain MRI Keppra until decision whether this seizure event was not due to Acute CNS process Slow correction of hyponatremia with Normal Saline IV CIWA protocol. EEG Consult Neurology for evaluation and recommendation around cause and treatment of new onset seizures
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
## 2013-02-17 NOTE — Progress Notes (Signed)
OT Cancellation Note  Patient Details Name: CRYSTALLE POPWELL MRN: [id number] DOB: 01-Dec-1950   Cancelled Treatment:    Reason Eval/Treat Not Completed: Medical issues which prohibited therapy . Pt too agitated and had Ativan.. Nursing did not want pt disturbed. Will return tomorrow.  02/17/2013 Cipriano Mile OTR/L Pager [phone] Office [phone]

## 2013-02-17 NOTE — Procedures (Signed)
ELECTROENCEPHALOGRAM REPORT   Patient: Morgan Kelly       Room #: 1O10 EEG No. ID: [id number] Age: 62 y.o.        Sex: female Referring Physician: McDiarmid Report Date:  02/17/2013        Interpreting Physician: Thana Farr D  History: Morgan Kelly is an 62 y.o. female with new onset seizures  Medications:  Scheduled: . citalopram  40 mg Oral Daily  . enoxaparin  40 mg Subcutaneous Q24H  . ferrous sulfate  325 mg Oral TID PC  . folic acid  1 mg Oral Daily  . levothyroxine  200 mcg Oral QAC breakfast  . LORazepam  0-4 mg Intravenous Q6H   Followed by  . [START ON 02/19/2013] LORazepam  0-4 mg Intravenous Q12H  . mometasone-formoterol  2 puff Inhalation BID  . montelukast  10 mg Oral QHS  . multivitamin with minerals  1 tablet Oral Daily  . oxyCODONE-acetaminophen  1 tablet Oral Once  . sodium chloride  3 mL Intravenous Q12H  . thiamine  100 mg Oral Daily   Or  . thiamine  100 mg Intravenous Daily    Conditions of Recording:  This is a 16 channel EEG carried out with the patient in the awake, drowsy and asleep states.  Description:  The waking background activity consists of a low voltage, symmetrical, fairly well organized, 10 Hz alpha activity, seen from the parieto-occipital and posterior temporal regions.  Low voltage fast activity, poorly organized, is seen anteriorly and is at times superimposed on more posterior regions.  A mixture of theta and alpha rhythms are seen from the central and temporal regions. The patient drowses with slowing to irregular, low voltage theta and beta activity.   The patient goes in to a light sleep briefly with symmetrical sleep spindles, vertex central sharp transients and irregular slow activity.  Hyperventilation and intermittent photic stimulation were not performed.  IMPRESSION: This is a normal EEG   Thana Farr, MD [facility] [phone] 02/17/2013, 6:54 PM

## 2013-02-17 NOTE — ED Notes (Signed)
MD at bedside. 

## 2013-02-17 NOTE — H&P (Signed)
Family Medicine Teaching [facility] Admission History and Physical Service Pager: [phone]  Patient name: Morgan Kelly Medical record number: [id number] Date of birth: June 01, 1951 Age: 62 y.o. Gender: female  Primary Care Provider: Tonye Pearson, MD Consultants: none to date Code Status: presumed full (pt uncooperative with interview; need to clarify)  Chief Complaint: seizures witness at home  Assessment and Plan: Morgan Kelly is a 62 y.o. year old female presenting with witnessed seizure x2 (7 PM 8/26, around midnight 8/27). Pt has a complex past medical history including chronic pain/fibromyalgia, nonspecific mood/anxiety disorder, recent distal femur fracture, recent left thumb dislocation surgery, and history of PE, now on Lovenox s/p surgery. History and physical extremely limited due to pt cooperation; pt does not appear unable to answer but rather unwilling to participate in interview/exam at this time.  # Seizure - uncertain cause; DDx wide, new seizure disorder, hypoNa, alcohol/other substance withdrawal, stroke ... ... -CT with ?new stroke vs artifact in the right temporal lobe -s/p Keppra load in the ED, no further seizure activity for the last 6 hours -ordered for CIWA protocol -ordered for MRI to further characterize  -will need to consider neurology consult  # Hyponatremia - uncertain cause or rate of onset; Na 134 as recently as 7/28; reported vomiting and diarrhea -plan to rehydrate, NS at 100 mL/h and monitor with q8 BMP [ ]  f/u Na rise  # Fibromyalgia/chronic pain - uncertain baseline; pt reportedly without narcotics for two weeks - would prefer to avoid narcotics if possible, but will need to consider balancing pain control/?withdrawal/misuse hx - not ordered for narcotics currently due to inability to accurately assess current pain - continue home Robaxin PRN  # Recent surgery - pt with recent femur fracture and thumb surgery, now on Lovenox  for ?hx of PE -per at least one post-op note, pt supposedly bridging to Coumadin for 8 weeks, but Coumadin is not on med list -will need to clarify anticoagulation plans -consider consult orthopedic surgery (hand surgeon Dr. Janee Morn, femur surgery by Dr. Carola Frost)  # Mood disorder/anxiety - on several meds at home (Celexa, Xanax 2 mg TID, Donnatal PRN, Elavil qHS) -ordered to continue Celexa and on CIWA as above, but holding other medications -will need to consider psychiatry consult given likely contribution of mood/chronic pain to current status  # Hypothyroidism - continue home Synthroid  # Hx of asthma "in remission" per outpt notes - continue home albuterol PRN, home Singulair daily  # Other - pt's med list include Lasix "every three days for swelling/edema," spironolactone PRN for "acne" -holding these meds for now; likely restart at discharge, defer to PCP for further management  # Social - pt with apparent substance misuse of prescription medications as well as alcohol -will request social work consult to help with evaluation  FEN/GI: NPO pending swallow screen, then advance as tolerated, NS at 100 mL/h Prophylaxis: treatment-dose Lovenox; need to clarify if pt needs to be on Coumadin  Disposition: admit to stepdown unit, attending Dr. Perley Jain, with management as above  History of Present Illness: Morgan Kelly is a 62 y.o. year old female presenting with witnessed seizure x2 (7 PM 8/26, around midnight 8/27). Pt has a complex past medical history including chronic pain/fibromyalgia, nonspecific mood/anxiety disorder, recent distal femur fracture, recent left thumb dislocation surgery, and history of PE, now on Lovenox s/p surgery. History obtained from pt's sister; pt did not respond to questions (per sister, "she's tired and in pain and closed down,  she doesn't feel like talking.") Sister visits from time to time and was with pt around 7 PM last night and witnessed a  generalized seizure (pt sitting with arms drawn up, shaking/jerking uncontrollably, unresponsive for "several minutes"); pt had a post-ictal period of 10-15 minutes, did not lose bowel/bladder control. Afterwards, pt was able to have a conversation with sister. Sister states she went to her own home to get things to stay the night with pt and returned around midnight, and witnessed a second similar seizure around midnight and called EMS. Prior to this, pt had reported to sister some vomiting and diarrhea, and was concerned for a viral illness.  Pt was given IV fluids and Keppra in the ED, and CT scan was done showing possible new stroke vs artifact in the right temporal lobe. Pt was also found to be hyponatremic. Of note, pt was admitted in July for distal left femur fracture and had left thumb relocation surgery on 8/11. Pt has a history of chronic narcotic use and "has been out for about two weeks;" outpt notes reflect a reluctance by pt's PCP's office to prescribe further narcotics due to overuse/misuse.  Review Of Systems: Per HPI. LEVEL 5 CAVEAT, ROS unable to be performed secondary to pt's mental status/cooperation.  Patient Active Problem List   Diagnosis Date Noted  . Fracture of distal femur, left  01/15/2013  . Dislocation of left thumb, IP joint 01/15/2013  . Femur fracture 01/11/2013  . Raynaud's disease and hyperhidrosis resolved by sympathectomy 1990s 04/01/2012  . Cervical spondylosis With facet arthropathy 04/01/2012  . Degenerative disc disease, cervical 04/01/2012  . IBS (irritable bowel syndrome) 03/06/2012  . 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 Nicotine abuse 10/30/2011  . Chronic pain 09/05/2011  . Asthma in remission 09/05/2011  . Edema 09/05/2011  . GAD (generalized anxiety disorder) 09/05/2011  . Hypothyroid 09/05/2011  . Acne 09/05/2011  . Other and unspecified hyperlipidemia 09/05/2011  . Fibromyalgia 09/05/2011  . Eczema 09/05/2011  . Mood  disorder 09/05/2011  . PTSD (post-traumatic stress disorder) 09/05/2011  . Depression, major, recurrent 09/05/2011  . Abnormal LFTs 09/05/2011   Past Medical History: Past Medical History  Diagnosis Date  . Fibromyalgia   . Chronic pain syndrome   . Hypothyroid   . Psychiatric disorder   . CTS (carpal tunnel syndrome)   . Acne   . Eczema   . Edema   . Hyperlipidemia   . History of asthma   . Allergy   . GERD (gastroesophageal reflux disease)   . Depression   . Asthma   . Clotting disorder     factor V leiden   . Pulmonary embolus 2000  . Pulmonary emboli     post op tubal   Past Surgical History: Past Surgical History  Procedure Laterality Date  . Tubal ligation    . Bilateral thorascopic sympathetomies      for pain  . Tonsillectomy    . Orif femur fracture Left 01/14/2013    Procedure: OPEN REDUCTION INTERNAL FIXATION (ORIF) DISTAL FEMUR FRACTURE;  Surgeon: Budd Palmer, MD;  Location: [facility] OR;  Service: Orthopedics;  Laterality: Left;  . Upper gi endoscopy    . Colonoscopy    . Closed reduction finger with percutaneous pinning Left 02/01/2013    Procedure: OPEN REDUCTION, PERCUTANEOUS PINNING;  Surgeon: Jodi Marble, MD;  Location:  SURGERY CENTER;  Service: Orthopedics;  Laterality: Left;   Social History: History  Substance Use Topics  .  Smoking status: Current Every Day Smoker -- 1.00 packs/day for 14 years    Types: Cigarettes  . Smokeless tobacco: Never Used     Comment: i  am on the way to quiting "  . Alcohol Use: Yes     Comment: occas 2 beers   Additional social history: Lives alone. Per sister, "heavy" alcohol use and smoking. See also HPI. Please also refer to relevant sections of EMR.  Family History: Family History  Problem Relation Age of Onset  . Cancer Sister     hx of breast cancer   Allergies and Medications: Allergies  Allergen Reactions  . Alprazolam Other (See Comments)    "Hung over feeling"-can take now  . Elavil  [Amitriptyline Hcl]     Hangover-can take now  . Eszopiclone Other (See Comments)    Unknown   . Flexeril [Cyclobenzaprine Hcl]   . Neurontin [Gabapentin] Other (See Comments)    hallucinations  . Other Other (See Comments)    Steroids by mouth or injections - psychosis  . Pregabalin     Blurred vision  . Soma [Carisoprodol] Nausea And Vomiting  . Ultracet [Tramadol-Acetaminophen] Nausea Only    Vision changes  . Ultram [Tramadol Hcl]     Visual changes  . Wellbutrin [Bupropion Hcl]     anxious  . Zolpidem Tartrate    No current facility-administered medications on file prior to encounter.   Current Outpatient Prescriptions on File Prior to Encounter  Medication Sig Dispense Refill  . acetaminophen (TYLENOL) 500 MG tablet Take 1-2 tablets (500-1,000 mg total) by mouth every 6 (six) hours as needed for pain or fever.  30 tablet  0  . albuterol (PROVENTIL HFA;VENTOLIN HFA) 108 (90 BASE) MCG/ACT inhaler Inhale 1-2 puffs into the lungs every 6 (six) hours as needed for wheezing or shortness of breath.      . alprazolam (XANAX) 2 MG tablet Take 1 tablet (2 mg total) by mouth 3 (three) times daily as needed for anxiety.  90 tablet  2  . amitriptyline (ELAVIL) 50 MG tablet Take 50-100 mg by mouth at bedtime as needed for sleep.       . belladonna alk-PHENObarbital (DONNATAL) 16.2 MG tablet Take 1 tablet by mouth every 8 (eight) hours as needed.       . citalopram (CELEXA) 40 MG tablet Take 1 tablet (40 mg total) by mouth daily.  90 tablet  3  . clindamycin (CLEOCIN T) 1 % lotion Apply 1 application topically daily as needed. Apply sparingly and massage in.      . diphenoxylate-atropine (LOMOTIL) 2.5-0.025 MG per tablet Take 1 tablet by mouth 4 (four) times daily as needed. diarrhea  30 tablet  0  . docusate sodium 100 MG CAPS Take 100 mg by mouth 2 (two) times daily.  10 capsule  0  . enoxaparin (LOVENOX) 40 MG/0.4ML injection Inject 0.4 mLs (40 mg total) into the skin daily.  14 Syringe  0   . ferrous sulfate 325 (65 FE) MG tablet Take 1 tablet (325 mg total) by mouth 3 (three) times daily after meals.  90 tablet  0  . Fluticasone-Salmeterol (ADVAIR) 250-50 MCG/DOSE AEPB Inhale 1 puff into the lungs every 12 (twelve) hours.  60 each  5  . furosemide (LASIX) 20 MG tablet Take 20 mg by mouth See admin instructions. Take every three days as needed for swelling/edema.      Marland Kitchen levothyroxine (SYNTHROID, LEVOTHROID) 200 MCG tablet Take 1 tablet (200 mcg total)  by mouth daily.  90 tablet  3  . lidocaine (LIDODERM) 5 % Place 3 patches onto the skin daily. Remove & Discard patch within 12 hours or as directed by MD  90 patch  5  . loratadine (CLARITIN) 10 MG tablet Take 10 mg by mouth daily as needed for allergies.       . Magnesium Hydroxide (MILK OF MAGNESIA PO) Take 5-10 mLs by mouth daily. Constipation      . methocarbamol (ROBAXIN) 500 MG tablet Take 1-2 tablets (500-1,000 mg total) by mouth 4 (four) times daily as needed (spasms).  80 tablet  0  . montelukast (SINGULAIR) 10 MG tablet Take 10 mg by mouth at bedtime.      Marland Kitchen oxycodone (ROXICODONE) 30 MG immediate release tablet Take 1 tablet (30 mg total) by mouth 2 (two) times daily. For 01/2713 or after  60 tablet  0  . Oxycodone HCl 20 MG TABS Take 1 tablet (20 mg total) by mouth every 6 (six) hours as needed. For 02/17/13 or after  120 tablet  0  . oxyCODONE-acetaminophen (PERCOCET) 10-325 MG per tablet Take 1 tablet by mouth every 4 (four) hours as needed for pain.  40 tablet  0  . spironolactone (ALDACTONE) 100 MG tablet Take 100 mg by mouth daily as needed (to control acne).         Objective: BP 116/73  Pulse 83  Temp(Src) 97.7 F (36.5 C) (Axillary)  Resp 20  SpO2 98% Exam: General: adult female lying in bed, opens eyes to sternal rub but does not answer questions or cooperate with exam  Pt seen to move all four extremities and open eyes spontaneously, but does not interact HEENT: [place]/AT, unable to visualize oropharynx, eyes, or  throat properly due to cooperation Cardiovascular: difficult to examine secondary to positioning and lack of cooperation  RRR, no obvious mumur appreciated Respiratory: lungs CTAB though pt would not take deep breaths Abdomen: soft, nontender, BS+ Extremities: left hand/thumb in cast, left lower extremity in imbolizer Skin: warm, dry, no frank rashes noted, though limited exam due to cooperation Neuro: unable to test properly secondary to patient cooperation  Pupils equally round reactive to light  Labs and Imaging: CBC BMET   Recent Labs Lab 02/17/13 0145  WBC 7.1  HGB 12.8  HCT 35.7*  PLT 294    Recent Labs Lab 02/17/13 0145  NA 122*  K 4.3  CL 90*  CO2 18*  BUN 6  CREATININE 0.52  GLUCOSE 103*  CALCIUM 9.2     Alk Phos 141 Alb 3.9 AST 19 ALT 11 Total protein 7.3 Total bili 0.4  Alcohol <11 UDS: positive for barbiturates  CT Head without contrast - subtle hypoattenuation of right temporal lobe, recommended MRI per radiology   Bobbye Morton, MD 02/17/2013, 5:49 AM PGY-2, [place] [facility] FPTS Intern pager: [phone], text pages welcome

## 2013-02-17 NOTE — Progress Notes (Signed)
Family Medicine Teaching Service Daily Progress Note Intern Pager: [phone]  Patient name: Morgan Kelly Medical record number: [id number] Date of birth: [birth year] Age: 61 y.o. 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    Per sister's report, pt had run out of her pain meds about two weeks ago and had been self-medicating with alcohol since (2-3 24oz cans of beer/day).  Pt reports this afternoon she still has plenty of pain meds at home, though she cannot recall which kind (but would like some more while in the hospital).  She also cannot recall when she had her last drink of alcohol but reports it was at least a week ago.  She does not remember any of the events surrounding her seizures last night.  Ezzard Flax, Med Student 02/17/2013, 3:08 PM FPTS Intern pager: [phone], text pages welcome  Agree, updated to DNR/DNI.   Aldine Contes. Marti Sleigh, MD, PGY3 02/17/2013 3:28 PM

## 2013-02-17 NOTE — Progress Notes (Signed)
Pt requesting pain medicine. Notified MD, they will be by to speak with pt soon. Will continue to monitor.

## 2013-02-17 NOTE — Progress Notes (Signed)
Sister at bedside asking if someone can look into an ortho consult. Left wrist cast was due to be removed today  And knee immoblizer is causing discomfort and pt and sister would like it to be looked at.  Thank you. Notified MD of request. Will continue to monitor.

## 2013-02-17 NOTE — ED Notes (Signed)
Pt up on bedside commode, pt had fast onset diarrhea. MD informed.

## 2013-02-17 NOTE — ED Notes (Signed)
Attempted to give report 

## 2013-02-17 NOTE — Progress Notes (Signed)
Pt refusing all po meds, stating "I have those at home. I want to go home." Refusing pain medicine that is ordered (tylenol) stating "it is bad for me, it is toxic"  But is requesting Vicodin because that is what she took at home. Repeated she wanted to go home. RN and Charge at bedside during this time. Will continue to monitor.  Paged MD awaiting call back.

## 2013-02-17 NOTE — Progress Notes (Signed)
Portable EEG completed

## 2013-02-17 NOTE — Progress Notes (Signed)
PT Cancellation Note  Patient Details Name: MONAY HOULTON MRN: [id number] DOB: 1950-07-01   Cancelled Treatment:    Reason Eval/Treat Not Completed: Medical issues which prohibited therapy.  Pt too agitated and had Ativan.  Nursing did not want pt disturbed.  Will return tomorrow.    INGOLD,Marilyne Haseley 02/17/2013, 12:56 PM  [facility] [phone] (pager)

## 2013-02-17 NOTE — Progress Notes (Signed)
CIWA 14 at noon check gave 1mg  Ativan per order set, remains impulsive and confused. Not combative at this time, compared to morning. Will continue to monitor. Bed alarm on.

## 2013-02-17 NOTE — Progress Notes (Signed)
Utilization review completed.  

## 2013-02-18 ENCOUNTER — Telehealth: Payer: Self-pay

## 2013-02-18 ENCOUNTER — Inpatient Hospital Stay (HOSPITAL_COMMUNITY): Payer: Medicare Other

## 2013-02-18 DIAGNOSIS — R569 Unspecified convulsions: Secondary | ICD-10-CM | POA: Diagnosis present

## 2013-02-18 DIAGNOSIS — E039 Hypothyroidism, unspecified: Secondary | ICD-10-CM

## 2013-02-18 DIAGNOSIS — M47812 Spondylosis without myelopathy or radiculopathy, cervical region: Secondary | ICD-10-CM

## 2013-02-18 DIAGNOSIS — F419 Anxiety disorder, unspecified: Secondary | ICD-10-CM

## 2013-02-18 DIAGNOSIS — M5030 Other cervical disc degeneration, unspecified cervical region: Secondary | ICD-10-CM

## 2013-02-18 DIAGNOSIS — IMO0002 Reserved for concepts with insufficient information to code with codable children: Secondary | ICD-10-CM

## 2013-02-18 LAB — BASIC METABOLIC PANEL
BUN: 3 mg/dL — ABNORMAL LOW (ref 6–23)
CO2: 20 mEq/L (ref 19–32)
Calcium: 8.1 mg/dL — ABNORMAL LOW (ref 8.4–10.5)
Calcium: 8.2 mg/dL — ABNORMAL LOW (ref 8.4–10.5)
Chloride: 103 mEq/L (ref 96–112)
Creatinine, Ser: 0.66 mg/dL (ref 0.50–1.10)
Creatinine, Ser: 0.62 mg/dL (ref 0.50–1.10)
GFR calc Af Amer: >90 mL/min (ref >90)
GFR calc non Af Amer: >90 mL/min (ref >90)
GFR calc non Af Amer: >90 mL/min (ref >90)
Sodium: 136 mEq/L (ref 135–145)

## 2013-02-18 MED ORDER — HYDROCODONE-ACETAMINOPHEN 5-325 MG PO TABS: 1.0000 | ORAL_TABLET | Freq: Four times a day (QID) | ORAL | Status: DC | PRN

## 2013-02-18 MED ORDER — OXYCODONE HCL 5 MG PO TABS
5.0000 mg | ORAL_TABLET | ORAL | Status: DC | PRN
  Administered 2013-02-18: 5 mg via ORAL
  Filled 2013-02-18: qty 2

## 2013-02-18 MED ORDER — POTASSIUM CHLORIDE CRYS ER 20 MEQ PO TBCR
40.0000 meq | EXTENDED_RELEASE_TABLET | Freq: Once | ORAL | Status: AC
  Administered 2013-02-18: 40 meq via ORAL
  Filled 2013-02-18: qty 2

## 2013-02-18 MED ORDER — LEVOTHYROXINE SODIUM 200 MCG PO TABS: 200.0000 ug | ORAL_TABLET | Freq: Every day | ORAL | Status: DC

## 2013-02-18 MED ORDER — OXYCODONE HCL 5 MG PO TABS
10.0000 mg | ORAL_TABLET | Freq: Once | ORAL | Status: AC
  Administered 2013-02-18: 10 mg via ORAL
  Filled 2013-02-18: qty 2

## 2013-02-18 MED ORDER — ENOXAPARIN SODIUM 40 MG/0.4ML ~~LOC~~ SOLN: 40.0000 mg | SUBCUTANEOUS | Status: DC

## 2013-02-18 MED ORDER — GADOBENATE DIMEGLUMINE 529 MG/ML IV SOLN
13.0000 mL | Freq: Once | INTRAVENOUS | Status: AC
  Administered 2013-02-18: 13 mL via INTRAVENOUS
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
## 2013-02-18 NOTE — Telephone Encounter (Signed)
See last OV at which she was given rx for her 2 chronic pain meds to be filled on or after 02/17/13//she should be good til our appt in September////apparently she overused the meds she got in late July and ran out early///she knew from our past contracts that she could not get early refills

## 2013-02-18 NOTE — Progress Notes (Signed)
Per Dr Carollee Massed office: cast is to remain on for hospital stay; Pt to return to office for cast d/c & f/up with Dr. Karie Schwalbe.
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
## 2013-02-18 NOTE — Progress Notes (Signed)
Received report on patient coming from stepdown.  Awaiting patient arrival to floor.

## 2013-02-18 NOTE — Progress Notes (Signed)
Family Medicine Teaching Service Daily Progress Note Intern Pager: [phone]  Patient name: Morgan Kelly Medical record number: [id number] Date of birth: Aug 07, 1950 Age: 62 y.o. Gender: female  Primary Care Provider: Tonye Pearson, MD Consultants: NEUROLOGY, ORTHO Code Status: DNR/DNI  Pt Overview and Major Events to Date:  Morgan Kelly is a 62 y.o. year old female presenting with witnessed seizure x2 (7 PM 8/26, around midnight 8/27). Pt has a complex past medical history including chronic pain/fibromyalgia, nonspecific mood/anxiety disorder, recent distal femur fracture, recent left thumb dislocation surgery, and history of PE, now on Lovenox s/p surgery. History and physical extremely limited due to pt cooperation; pt does not appear unable to answer but rather unwilling to participate in interview/exam at this time.  Assessment and Plan: # Seizure - uncertain cause; DDx wide, new seizure disorder, hypoNa, alcohol/other substance withdrawal, stroke ... ...  -CT with ?new stroke vs artifact in the right temporal lobe  -s/p Keppra load in the ED, no further seizure activity for the last 6 hours  -ordered for CIWA protocol  -ordered for MRI to further characterize  [ ]  f/u neurology recs  # Hyponatremia/hypokalemia - hyponatremia resolved, no new reported vomiting or diarrhea  - continue to rehydrate, NS at 100 mL/h and monitor with q8 BMP - hypokalemia 3.7 --> 2.9 (8/28); repleted with KCl [ ]  f/u hypokalemia  # Fibromyalgia/chronic pain - uncertain baseline; pt reportedly without narcotics for two weeks  - given hx of substance abuse, will not give scheduled narcotics.  Has pain contract with Dr. Merla Riches and should have had enough meds to last until next appt in September. F/u with him. - continue home Robaxin PRN; ibuprofen PRN  # Recent surgery - pt with recent femur fracture and thumb surgery, now on for ?hx of PE, (hand surgeon Dr. Janee Morn, femur surgery by  Dr. Carola Frost)  - f/u with Dr. Janee Morn for cast d/c  - will clarify anticoag plan  # Mood disorder/anxiety - on several meds at home (Celexa, Xanax 2 mg TID, Donnatal PRN, Elavil qHS)  -ordered to continue Celexa and on CIWA as above, but holding other medications   # Hypothyroidism - continue home Synthroid   # Hx of asthma "in remission" per outpt notes - continue home albuterol PRN, home Singulair daily   # Other - pt's med list include Lasix "every three days for swelling/edema," spironolactone PRN for "acne"  -holding these meds for now; likely restart at discharge, defer to PCP for further management   # Social - pt with apparent substance misuse of prescription medications as well as alcohol  -social work [employer]   FEN/GI: regular diet, NS at 100 mL/h  Prophylaxis: treatment-dose Lovenox; need to clarify if pt needs to be on Coumadin   Disposition: med/surg floor  Subjective: PT sitting on EOB in NAD, pleasant and cooperative.  Reports some pain in her left hand and left leg overnight that was relieved with oxycodone.  No report of N/V, dizziness, tremors, SOB or chest pain.  Objective: Temp:  [98.2 F (36.8 C)-99.6 F (37.6 C)] 99.6 F (37.6 C) (08/28 0419) Pulse Rate:  [64-82] 73 (08/28 0500) Resp:  [18-26] 25 (08/28 0419) BP: (118-138)/(45-70) 118/56 mmHg (08/28 0500) SpO2:  [94 %-97 %] 96 % (08/28 0419) Weight:  [126 lb 1.7 oz (57.2 kg)] 126 lb 1.7 oz (57.2 kg) (08/28 0423) Physical Exam: General: alert, cooperative and no distress  HEENT: PERRLA, extra ocular movement intact, neck supple with midline trachea  Heart: S1, S2 normal, no murmur, rub or gallop, regular rate and rhythm  Lungs: clear to auscultation, no wheezes or rales and unlabored breathing  Abdomen: abdomen is soft without significant tenderness, masses, organomegaly or guarding  Extremities: extremities normal, atraumatic, no cyanosis or edema, specifically no calf pain, swelling or tenderness. Left  wrist cast and left leg brace in place. Skin:no rashes, no ecchymoses  Neurology: normal without focal findings, mental status, speech normal, alert and oriented x3 and PERLA    Laboratory:  Recent Labs Lab 02/17/13 0145  WBC 7.1  HGB 12.8  HCT 35.7*  PLT 294    Recent Labs Lab 02/17/13 0330  02/17/13 2110 02/18/13 0155 02/18/13 0840  NA  --   < > 133* 135 136  K  --   < > 3.0* 2.9* 3.2*  CL  --   < > 101 103 103  CO2  --   < > 21 21 20   BUN  --   < > 3* 3* 4*  CREATININE  --   < > 0.64 0.66 0.62  CALCIUM  --   < > 8.4 8.1* 8.2*  PROT 7.3  --   --   --   --   BILITOT 0.4  --   --   --   --   ALKPHOS 141*  --   --   --   --   ALT 11  --   --   --   --   AST 19  --   --   --   --   GLUCOSE  --   < > 88 153* 133*  < > = values in this interval not displayed.  Imaging/Diagnostic Tests:  8/27 - CT Head without contrast - subtle hypoattenuation of right temporal lobe, recommended MRI per radiology  8/27 - EEG normal  8/28 - EKG: NSR, prolonged QT  Ezzard Flax, Med Student 02/18/2013, 9:50 AM FPTS Intern pager: [phone], text pages welcome  FPTS Upper Level Addendum  S: Pt seen at bedside. Some pain in her thumb and leg s/p surgeries but better with medications here. Does not generally remember much about the day on which she had seizures or events surrounding admission. Generally feels "okay" now, interested in going home soon.  O: I have reviewed the patient's current medications, labs/imaging, and diagnostic tests. BP 128/72  Pulse 73  Temp(Src) 98.5 F (36.9 C) (Oral)  Resp 25  Ht 5\' 1"  (1.549 m)  Wt 126 lb 1.7 oz (57.2 kg)  BMI 23.84 kg/m2  SpO2 100% General: adult female in NAD, alert/oriented Cardio: RRR Pulm: CTAB, no wheezes  Abd: soft, nontender, BS+ Ext: left hand in cast, left leg in immobilizer; moves all extremities equally Neuro: grossly non-focal without obvious deficit; no seizure-like activity  A/P: 62yo female with witnessed seizures of  uncertain etiology; complex PMH including chronic pain, nonspecific mood disorder, and recent femur fracture and left thumb dislocation surgeries.  # Seizures - continue CIWA, f/u MRI and neurology recs  # Hyponatremia - resolved with IVF # Hypokalemia - 2.9 on 8/28, repleted orally; f/u BMP  # Fibromyalgia/chronic pain - hx of ?misuse/overuse; outpt physician has pain contract with pt and pt apparently has Rx to fill from him already; will need to f/u with PCP for medication management  # Femur/left hand surgeries - ortho recommending outpt f/u; likely will d/c on home Lovenox with ortho f/u for anticoag plans  # Mood disorder/anxiety - continue Celexa; holding  home meds (Xanax, Donnatol, Elavil)  # Hypothyroidism - continue Synthroid  # FEN/GI: regular diet, saline lock IV # Prophylaxis: treatment dose Lovenox (?previous home med); likely will defer to outpt ortho for anticoag recs s/p surgery  Dispo: transfer to regular floor; management as above; potential discharge home today pending MRI/neuro recs  I have independently interviewed and examined this patient in conjunction with Mr. Marquita Palms, whose note is documented above. The above addendum reflects my independent interview, exam, and assessment/plan. Please see also attending notes.   Bobbye Morton, MD  PGY-2, [facility] Family Medicine  02/18/2013, 1:05 PM FPTS Service Pager: [phone] (text pages welcome through [employer], password "[email or web address]")

## 2013-02-18 NOTE — Progress Notes (Addendum)
Pt to TX to 6E-04, VSS, called report. Recommended bed alarm for Pt; d/t impulsive behavior/poor safety judgement.

## 2013-02-18 NOTE — Telephone Encounter (Signed)
Can not send in Oxycodone, see hospital report/ pt admitted for seizure secondary for probable overuse of narcotics/ withdrawals, use of narcotics with alcohol. Unsure why Oxycodone would be needed if she is currently in hospital. Sent in her thyroid meds/ to you for review also. We can speak to Steward Drone, she appears to be POA for patient.

## 2013-02-18 NOTE — Progress Notes (Signed)
Nutrition Brief Note  Malnutrition Screening Tool result is inaccurate.  Please consult if nutrition needs are identified.  Kimberly Harris, RD, LDN, CNSC Pager [phone] After Hours Pager [phone]   

## 2013-02-18 NOTE — Telephone Encounter (Signed)
Patient needs a refill on Oxycodone and Levothyroxine. [employer]. Please call patient's sister Steward Drone at [phone]. Patient is currently in the hospital.
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
## 2013-02-18 NOTE — Care Management Note (Signed)
    Page 1 of 2   02/18/2013     4:50:29 PM   CARE MANAGEMENT NOTE 02/18/2013  Patient:  Morgan Kelly, Morgan Kelly   Account Number:  [id number]  Date Initiated:  02/17/2013  Documentation initiated by:  Donn Pierini  Subjective/Objective Assessment:   Pt admitted with seizure ?withdrawal on CIWA     Action/Plan:   PTA pt lived at home- NCM to follow for d/c needs   Anticipated DC Date:  02/19/2013   Anticipated DC Plan:  HOME W HOME HEALTH SERVICES      DC Planning Services  CM consult      [facility] Choice  Resumption Of Svcs/PTA Provider   Choice offered to / List presented to:  C-1 Patient        HH arranged  HH-2 PT  HH-3 OT      [facility] agency  [facility]   Status of service:  Completed, signed off Medicare Important Message given?   (If response is "NO", the following Medicare IM given date fields will be blank) Date Medicare IM given:   Date Additional Medicare IM given:    Discharge Disposition:  HOME W HOME HEALTH SERVICES  Per UR Regulation:  Reviewed for med. necessity/level of care/duration of stay  If discussed at Long Length of Stay Meetings, dates discussed:    Comments:  02/18/13- 1030- Donn Pierini RN, BSN [phone] Spoke with pt at bedside- per conversation pt states that she lives alone- and had [facility] services PTA with [facility] for PT/OT- pt will need resumption orders at time of d/c to resume HH sevices. NCM to follow  1600- Donn Pierini RN, BSN- received call from resident MD that pt was to be discharged today- HH orders to be resumed- went to pt's room- but pt had been tx to 6E- referral called to Lupita Leash with [facility]- to resume [facility]- PT/OT- services to begin within 24-48 hr post discharge.

## 2013-02-18 NOTE — Telephone Encounter (Signed)
Do you want her to fill these? See note from hospital admission. Also concerned sister requesting the meds and patient still in hospital, Ellyson Rarick

## 2013-02-20 NOTE — Telephone Encounter (Signed)
Called to check the status/ I believe she was discharged yesterday//she will need pain medication but she did receive prescriptions at her last visit for August 27-to be filled on or after August 27

## 2013-02-20 NOTE — Progress Notes (Signed)
FMTS Attending Daily Note:  Renold Don MD  [phone] pager  Family Practice pager:  [phone] I have seen and examined this patient and have reviewed their chart. I have discussed this patient with the resident. I agree with the resident's findings, assessment and care plan.  Tobey Grim, MD 02/20/2013 12:47 PM

## 2013-02-20 NOTE — Discharge Summary (Signed)
Family Medicine Teaching Service  Discharge Note : Attending Renold Don MD Pager [phone] Inpatient Team Pager:  [phone]  I have reviewed this patient and the patient's chart and have discussed discharge planning with the resident at the time of discharge. I agree with the discharge plan as above.  Additionally: Witnessed seizure activity without clear etiology in the hospital.  Likely secondary to sedative withdrawal.  Complete resolved with negative EEG/MRI in hospital.

## 2013-02-22 NOTE — Telephone Encounter (Signed)
Thanks. I will check today to see how she is.

## 2013-02-22 NOTE — Telephone Encounter (Signed)
Called [facility]. They are closed today for holiday. Left message for them to call Britta Mccreedy back tomorrow and advise when she last got the Rx. Called [place]. Left message to call me back. To you FYI

## 2013-02-23 NOTE — Telephone Encounter (Signed)
So she's good til 9/28

## 2013-02-23 NOTE — Telephone Encounter (Signed)
Received VM from Morgan Kelly at [facility] to report pt's last refills of Oxycodone. Pt received #120 of the 20 mg on 02/18/13 and also #60 of the 30 mg, also on 02/18/13. Dr Merla Riches, [place]. Do you want me to call sister back and deny refill?

## 2013-02-24 MED ORDER — ALPRAZOLAM 1 MG PO TABS: 1.0000 mg | ORAL_TABLET | Freq: Three times a day (TID) | ORAL | Status: DC | PRN

## 2013-02-24 NOTE — Telephone Encounter (Signed)
decr alpraz to 1mg  tid

## 2013-02-24 NOTE — Telephone Encounter (Signed)
Pt sister states pt is ok on pain meds but when pt was taken into the hospital she went by her house and noticed that the house was a mess and stuff was everywhere.  Her bottles for alprazolam and antidepressant was empty.  Her sister states that pt has now accepted her help with her meds and she will put them in a med box.  Sister also stated if the alprazolam can be decreased to 1mg  because she thinks its to strong.  I believe pt should have enough refills for antidepressant (celexa).  Please advise.

## 2013-02-25 NOTE — Telephone Encounter (Signed)
Pt sister notified that rxs were sent in.

## 2013-03-01 ENCOUNTER — Telehealth: Payer: Self-pay

## 2013-03-01 NOTE — Telephone Encounter (Signed)
Case manager from [facility] called to report that they were supposed to have a resumption of care for pt last week, but nurse did not get by to see pt. She stated that a nurse will see pt tomorrow, but wanted Dr Merla Riches to know she was not seen last week.

## 2013-03-04 ENCOUNTER — Telehealth: Payer: Self-pay

## 2013-03-04 NOTE — Telephone Encounter (Signed)
Patient called stated she has appt. With Dr Merla Riches on Wed. Pt. Does not want her sister to even be ask to go in back with her when seen. Her sister will bring her to the appt. Any questions before then call pt at [phone]

## 2013-03-04 NOTE — Telephone Encounter (Signed)
Patients sister has taken over her medications, patient reluctant to allow this to happen, to youFYI

## 2013-03-09 ENCOUNTER — Telehealth: Payer: Self-pay

## 2013-03-09 NOTE — Telephone Encounter (Signed)
ALEXIS FROM [facility] STATING PT DIDN'T WANT THEM TO GO OVER TODAY AND THEY USUALLY GO TWICE A WEEK, BUT ONLY WENT ONCE A WEEK THIS TIME SHE IS TO REPORT AND LET us KNOW WHEN THEY DO GO OVER TO HER HOME. YOU MAY REACH THEM AT [phone] IF NEEDED

## 2013-03-10 ENCOUNTER — Encounter: Payer: Self-pay | Admitting: Internal Medicine

## 2013-03-10 ENCOUNTER — Telehealth: Payer: Self-pay

## 2013-03-10 ENCOUNTER — Ambulatory Visit (INDEPENDENT_AMBULATORY_CARE_PROVIDER_SITE_OTHER): Payer: Medicare Other | Admitting: Internal Medicine

## 2013-03-10 VITALS — BP 96/55 | HR 73 | Temp 97.6°F | Resp 18 | Wt 119.0 lb

## 2013-03-10 DIAGNOSIS — G894 Chronic pain syndrome: Secondary | ICD-10-CM

## 2013-03-10 DIAGNOSIS — M797 Fibromyalgia: Secondary | ICD-10-CM

## 2013-03-10 DIAGNOSIS — D62 Acute posthemorrhagic anemia: Secondary | ICD-10-CM

## 2013-03-10 DIAGNOSIS — F411 Generalized anxiety disorder: Secondary | ICD-10-CM

## 2013-03-10 DIAGNOSIS — R945 Abnormal results of liver function studies: Secondary | ICD-10-CM

## 2013-03-10 DIAGNOSIS — F339 Major depressive disorder, recurrent, unspecified: Secondary | ICD-10-CM

## 2013-03-10 DIAGNOSIS — E871 Hypo-osmolality and hyponatremia: Secondary | ICD-10-CM

## 2013-03-10 DIAGNOSIS — M47812 Spondylosis without myelopathy or radiculopathy, cervical region: Secondary | ICD-10-CM

## 2013-03-10 DIAGNOSIS — R7989 Other specified abnormal findings of blood chemistry: Secondary | ICD-10-CM

## 2013-03-10 DIAGNOSIS — F4310 Post-traumatic stress disorder, unspecified: Secondary | ICD-10-CM

## 2013-03-10 DIAGNOSIS — G8929 Other chronic pain: Secondary | ICD-10-CM

## 2013-03-10 DIAGNOSIS — M5030 Other cervical disc degeneration, unspecified cervical region: Secondary | ICD-10-CM

## 2013-03-10 DIAGNOSIS — E039 Hypothyroidism, unspecified: Secondary | ICD-10-CM

## 2013-03-10 DIAGNOSIS — K589 Irritable bowel syndrome without diarrhea: Secondary | ICD-10-CM

## 2013-03-10 DIAGNOSIS — E876 Hypokalemia: Secondary | ICD-10-CM

## 2013-03-10 DIAGNOSIS — IMO0001 Reserved for inherently not codable concepts without codable children: Secondary | ICD-10-CM

## 2013-03-10 DIAGNOSIS — R3915 Urgency of urination: Secondary | ICD-10-CM

## 2013-03-10 DIAGNOSIS — F39 Unspecified mood [affective] disorder: Secondary | ICD-10-CM

## 2013-03-10 DIAGNOSIS — R197 Diarrhea, unspecified: Secondary | ICD-10-CM

## 2013-03-10 DIAGNOSIS — Z720 Tobacco use: Secondary | ICD-10-CM

## 2013-03-10 LAB — POCT URINALYSIS DIPSTICK
Glucose, UA: NEGATIVE
Nitrite, UA: NEGATIVE
Protein, UA: NEGATIVE
Urobilinogen, UA: 0.2

## 2013-03-10 LAB — CBC WITH DIFFERENTIAL/PLATELET
Basophils Absolute: 0 10*3/uL (ref 0.0–0.1)
Eosinophils Absolute: 0.3 10*3/uL (ref 0.0–0.7)
Eosinophils Relative: 5 % (ref 0–5)
Lymphocytes Relative: 29 % (ref 12–46)
MCH: 32.9 pg (ref 26.0–34.0)
MCV: 97.4 fL (ref 78.0–100.0)
Neutrophils Relative %: 57 % (ref 43–77)
Platelets: 368 10*3/uL (ref 150–400)
RDW: 14.4 % (ref 11.5–15.5)
WBC: 6.1 10*3/uL (ref 4.0–10.5)

## 2013-03-10 LAB — POCT UA - MICROSCOPIC ONLY
Crystals, Ur, HPF, POC: NEGATIVE
Yeast, UA: NEGATIVE

## 2013-03-10 MED ORDER — OXYCODONE HCL 20 MG PO TABS: 20.0000 mg | ORAL_TABLET | Freq: Four times a day (QID) | ORAL | Status: DC | PRN

## 2013-03-10 MED ORDER — DIPHENOXYLATE-ATROPINE 2.5-0.025 MG PO TABS: 1.0000 | ORAL_TABLET | Freq: Four times a day (QID) | ORAL | Status: DC | PRN

## 2013-03-10 MED ORDER — OXYCODONE HCL ER 30 MG PO T12A: 30.0000 mg | EXTENDED_RELEASE_TABLET | Freq: Two times a day (BID) | ORAL | Status: DC

## 2013-03-10 MED ORDER — LORAZEPAM 1 MG PO TABS: 1.0000 mg | ORAL_TABLET | Freq: Three times a day (TID) | ORAL | Status: DC

## 2013-03-10 NOTE — Telephone Encounter (Signed)
Dr. Merla Riches,  Pt called and wants you to phone that she is not allergic to Alprazolam and she said her AVS said that she is. She also wants to know if you think Parafon would be a safe alternative to flexaril. Please call [phone]

## 2013-03-10 NOTE — Progress Notes (Signed)
Subjective:    Patient ID: Morgan Kelly, female    DOB: 1951-04-25, 62 y.o.   MRN: [id number]  HPIf/u post hospitalization Patient Active Problem List   Diagnosis Date Noted  . Seizures 02/18/2013  . Fracture of distal femur, left  01/15/2013  . Dislocation of left thumb, IP joint 01/15/2013  . Femur fracture 01/11/2013  . Raynaud's disease and hyperhidrosis resolved by sympathectomy 1990s 04/01/2012  . Cervical spondylosis With facet arthropathy 04/01/2012  . Degenerative disc disease, cervical 04/01/2012  . IBS (irritable bowel syndrome) 03/06/2012  . 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 Nicotine abuse 10/30/2011  . Chronic pain 09/05/2011  . Asthma in remission 09/05/2011  . Edema 09/05/2011  . GAD (generalized anxiety disorder) 09/05/2011  . Hypothyroid 09/05/2011  . Acne 09/05/2011  . Other and unspecified hyperlipidemia 09/05/2011  . Fibromyalgia 09/05/2011  . Eczema 09/05/2011  . Mood disorder 09/05/2011  . PTSD (post-traumatic stress disorder) 09/05/2011  . Depression, major, recurrent 09/05/2011  . Abnormal LFTs 09/05/2011  Morgan Kelly/Morgan Kelly current ortho probs See orig admit 7/14 trauma/fx  Then 2nd--hyponat,hypoka--N,V,D 24hr preceding //2 seizures at home then drug screen in ER neg for all but barbs(donnatal)--- this was certainly secondary to opiate withdrawal following overuse of home medications  Sister monitoring pain meds-but since hospitalization has not been controlling pain in femur after fracture  Using 1/2 x 30 bid plus 20 q6h but out now  In hospital on dilaudid drip plus 30 bid and 20 q 4-6 costant unbearable pain preventing sleep Home PT makes it worse(Morgan Kelly-PT) At odds w/ sister(her husband chronic pain-not doing well) Xanax was decreased to 1 mg 3 times a day because of confusion and lethargy observed by her sister and also because of variable use pattern with overuse  This has not controlled her anxiety since  discharge from the hospital/IV Ativan did well in the hospital and sister requests trial  Sister controls dispensing this as well--Morgan Kelly makes life Difficult for her    Review of Systems  Constitutional: Positive for fatigue. Negative for fever, appetite change and unexpected weight change.  HENT: Positive for neck pain and neck stiffness. Negative for hearing loss and trouble swallowing.   Eyes: Negative for visual disturbance.  Respiratory: Negative for shortness of breath.   Cardiovascular: Negative for chest pain, palpitations and leg swelling.  Gastrointestinal: Negative for abdominal pain.  Genitourinary:       Has had recent urinary urgency/this is been present off and on since a hospitalization/no flank pain or suprapubic pain although there is suprapubic pressure  Neurological: Negative for dizziness and headaches.  Psychiatric/Behavioral: Positive for sleep disturbance and dysphoric mood. Negative for suicidal ideas, confusion and self-injury. The patient is nervous/anxious.        Objective:   Physical Exam  Constitutional: She is oriented to person, place, and time. She appears well-developed and well-nourished.  Eyes: Conjunctivae and EOM are normal. Pupils are equal, round, and reactive to light.  Neck: No thyromegaly present.  Cardiovascular: Normal rate and regular rhythm.   Pulmonary/Chest: Effort normal.  Neurological: She is alert and oriented to person, place, and time. She has normal reflexes. No cranial nerve deficit.  Psychiatric:  Is very tearful at times/sister present during part of the exam and conflict is obvious Sister is able to verify problem areas that Morgan Kelly does not want to admit      60 minute office visit spent with she and sister    Assessment & Plan:  Unspecified hypothyroidism time for recheck - Plan: TSH  Hyponatremia at hospital admission - Plan: Comprehensive metabolic panel  Hypokalemia persisted throughout hospitalization- Plan:  Comprehensive metabolic panel  Abnormal LFTs time for recheck in view of excessive use of Tylenol - Plan: Comprehensive metabolic panel  Chronic pain syndrome exacerbated by acute pain from hand and femur issues- Plan: refill meds w/ sis control/set up chronic pain consult as her other problems will continue to be an issue once her acute orthopedic pain has resolved Degenerative disc disease, cervical Depression, major, recurrent Fibromyalgia Cervical spondylosis With facet arthropathy  Urgency of urination - Plan: POCT UA - Microscopic Only, POCT urinalysis dipstick  Postoperative anemia due to acute blood loss - Plan: CBC with Differential  IBS (irritable bowel syndrome) versus other cause of chronic recurrent diarrhea(IBS present for many years but she feels like this is different. Her attempted colonoscopy a few years ago by Morgan. Loreta Kelly was obscured by incomplete cleaning out- Plan: diphenoxylate-atropine (LOMOTIL) 2.5-0.025 MG per tablet//establish appointment with Morgan. Loreta Kelly  GAD (generalized anxiety disorder) Mood disorder PTSD (post-traumatic stress disorder) Borderline personality disorder  She finally agrees to psychiatric referral for more appropriate management of her chronic problems  Hypothyroid-time for recheck  Nicotine abuse-unwilling to quit at this point  Meds ordered this encounter  Medications  . OxyCODONE HCl ER 30 MG T12A    Sig: Take 30 mg by mouth 2 (two) times daily.    Dispense:  60 each    Refill:  0  . Oxycodone HCl 20 MG TABS    Sig: Take 1 tablet (20 mg total) by mouth every 6 (six) hours as needed.    Dispense:  120 tablet    Refill:  0  . LORazepam (ATIVAN) 1 MG tablet---trial    Sig: Take 1 tablet (1 mg total) by mouth every 8 (eight) hours. To replace xanax    Dispense:  90 tablet    Refill:  0  . diphenoxylate-atropine (LOMOTIL) 2.5-0.025 MG per tablet    Sig: Take 1 tablet by mouth 4 (four) times daily as needed. diarrhea    Dispense:  30  tablet    Refill:  0

## 2013-03-10 NOTE — Telephone Encounter (Signed)
Removed alprazolam from allergy list. Please comment on the Parafon. Thanks

## 2013-03-11 ENCOUNTER — Encounter: Payer: Self-pay | Admitting: Internal Medicine

## 2013-03-11 LAB — COMPREHENSIVE METABOLIC PANEL
ALT: 10 U/L (ref 0–35)
AST: 17 U/L (ref 0–37)
Albumin: 3.6 g/dL (ref 3.5–5.2)
CO2: 27 mEq/L (ref 19–32)
Calcium: 9.3 mg/dL (ref 8.4–10.5)
Chloride: 104 mEq/L (ref 96–112)
Creat: 0.62 mg/dL (ref 0.50–1.10)
Potassium: 4.7 mEq/L (ref 3.5–5.3)
Total Protein: 6.3 g/dL (ref 6.0–8.3)

## 2013-03-11 LAB — TSH: TSH: 0.219 u[IU]/mL — ABNORMAL LOW (ref 0.350–4.500)

## 2013-03-11 NOTE — Telephone Encounter (Signed)
Patient is calling us back regarding her labs she has questions she has already talked to [place] twice today she says

## 2013-03-11 NOTE — Telephone Encounter (Signed)
Dr Merla Riches indicates everything looks good she can d/c the iron Dr Merla Riches has sent her a letter. I called her to advise

## 2013-03-11 NOTE — Telephone Encounter (Signed)
Patient advised she should not take the parafon with Oxycodone and Ativan.

## 2013-03-11 NOTE — Telephone Encounter (Signed)
Parafon has interactions with both benzodiazepins and oxycodone to cause increased sleepiness

## 2013-03-13 ENCOUNTER — Telehealth: Payer: Self-pay | Admitting: *Deleted

## 2013-03-13 DIAGNOSIS — R350 Frequency of micturition: Secondary | ICD-10-CM

## 2013-03-13 NOTE — Telephone Encounter (Signed)
Call--urine showed resolution of infection in hospital Symptoms could be bladder related, Gyn related or due to medication interactions--rec GYN eval next

## 2013-03-15 NOTE — Telephone Encounter (Signed)
Pt CB and I advised her of referral to GYN. Pt agreed, BUT would like to ask some friends for recommendations and then call us back w/who she would like referral to. I left message on Referral VM and will also forward this to Lupita Leash to hold referral until pt calls [place] back.

## 2013-03-15 NOTE — Telephone Encounter (Signed)
Put in referral. Called patient. Voice mail not set up yet.

## 2013-03-17 ENCOUNTER — Telehealth: Payer: Self-pay

## 2013-03-17 NOTE — Telephone Encounter (Signed)
PT WOULD LIKE TO LEAVE A MESSAGE FOR DR DOOLITTLE NOT TO DISCUSS ANYTHING WITH HER SISTER UNLESS IT IS A DIRE EMERGENCY. ALSO WHEN HER SISTER IS IN THE ROOM, PLEASE DON'T DISCUSS ANYTHING ABOUT COUNSELING BECAUSE HER SISTER DOESN'T BELIEVE IN THAT AND THEY HAD AN ISSUE ABOUT IT FOR 3 DAYS. ONLY DISCUSS WITH HER PERSONALLY IF POSSIBLE PLEASE CALL [phone] IF NEEDED

## 2013-03-17 NOTE — Telephone Encounter (Signed)
PT WOULD LIKE A CALL BACK FROM AMY, STATES SHE KNOW WHAT'S GOING ON. PLEASE CALL [phone]

## 2013-03-21 ENCOUNTER — Telehealth: Payer: Self-pay

## 2013-03-21 NOTE — Telephone Encounter (Signed)
Call her to find out the specifics I prefer not to talk about these issues over the phone It is very important for her sister to be involved in her care We can set up therapy for the 2 of them to negotiate a contract if this would help Counseling would be very appropriate for Dalyla to be able to structure a contract with her sister that would work

## 2013-03-21 NOTE — Telephone Encounter (Signed)
Patient would like a cal back from Dr Merla Riches. States she is in a really bad place right now with the situation with her sister. States she has physical limitations but she is still trying to make her sister happy and her sister will only listen to Dr Merla Riches. Scared to say anything to her sister. Says her sister is withholding information from her and is blackmailing her. Please call back at [phone].

## 2013-03-23 ENCOUNTER — Telehealth: Payer: Self-pay

## 2013-03-23 ENCOUNTER — Telehealth: Payer: Self-pay | Admitting: Radiology

## 2013-03-23 NOTE — Telephone Encounter (Signed)
Patient states she is scheduled to see the psychiatrist . She has chosen her OB Gyn. She states she wants Dr Merla Riches to be aware.

## 2013-03-23 NOTE — Telephone Encounter (Signed)
Called her. Discussed. Lengthy conversation, Morgan Kelly

## 2013-03-23 NOTE — Telephone Encounter (Signed)
Patient advised / see previous message from Dr Merla Riches. Patient encouraged to continue to work with her sister. And to go to the therapist.

## 2013-03-23 NOTE — Telephone Encounter (Signed)
Our answering service called and stated that pt called an stated she would like someone to give her a call back, only to talk to her not her sister Call back number is [phone]

## 2013-03-31 ENCOUNTER — Ambulatory Visit (INDEPENDENT_AMBULATORY_CARE_PROVIDER_SITE_OTHER): Payer: Medicare Other | Admitting: Internal Medicine

## 2013-03-31 ENCOUNTER — Encounter: Payer: Self-pay | Admitting: Internal Medicine

## 2013-03-31 VITALS — BP 115/70 | HR 82 | Temp 98.6°F | Resp 16 | Ht 61.0 in | Wt 112.0 lb

## 2013-03-31 DIAGNOSIS — F4310 Post-traumatic stress disorder, unspecified: Secondary | ICD-10-CM

## 2013-03-31 DIAGNOSIS — S72302S Unspecified fracture of shaft of left femur, sequela: Secondary | ICD-10-CM

## 2013-03-31 DIAGNOSIS — Z23 Encounter for immunization: Secondary | ICD-10-CM

## 2013-03-31 DIAGNOSIS — R102 Pelvic and perineal pain: Secondary | ICD-10-CM

## 2013-03-31 DIAGNOSIS — F39 Unspecified mood [affective] disorder: Secondary | ICD-10-CM

## 2013-03-31 DIAGNOSIS — M47812 Spondylosis without myelopathy or radiculopathy, cervical region: Secondary | ICD-10-CM

## 2013-03-31 DIAGNOSIS — F339 Major depressive disorder, recurrent, unspecified: Secondary | ICD-10-CM

## 2013-03-31 DIAGNOSIS — F411 Generalized anxiety disorder: Secondary | ICD-10-CM

## 2013-03-31 DIAGNOSIS — M5030 Other cervical disc degeneration, unspecified cervical region: Secondary | ICD-10-CM

## 2013-03-31 DIAGNOSIS — G8929 Other chronic pain: Secondary | ICD-10-CM

## 2013-03-31 DIAGNOSIS — K589 Irritable bowel syndrome without diarrhea: Secondary | ICD-10-CM

## 2013-03-31 DIAGNOSIS — S63105S Unspecified dislocation of left thumb, sequela: Secondary | ICD-10-CM

## 2013-03-31 MED ORDER — OXYCODONE HCL 10 MG PO TABS: ORAL_TABLET | ORAL | Status: DC

## 2013-03-31 MED ORDER — OXYCODONE HCL ER 60 MG PO T12A: 60.0000 mg | EXTENDED_RELEASE_TABLET | Freq: Two times a day (BID) | ORAL | Status: DC

## 2013-03-31 MED ORDER — DIPHENOXYLATE-ATROPINE 2.5-0.025 MG PO TABS: 1.0000 | ORAL_TABLET | Freq: Four times a day (QID) | ORAL | Status: DC | PRN

## 2013-03-31 NOTE — Progress Notes (Signed)
  Subjective:    Patient ID: Morgan Kelly, female    DOB: 17-Jan-1951, 62 y.o.   MRN: [id number]  HPI Spoke with patient's sister Steward Drone) privately. She is currently holding onto patient's medications, giving them to her on a daily basis. She tried to fill a 7 day pill box, but patient would take all medications with in 3 days. She tried to do a three day supply for patient, but the medication would be gone with in 1.5 days. She reports that Marsheila has "impulse control issues," and is unable to wait for regular intervals to take her pain medication. This has been a problem with patient running out prior to time for refill. According to the patient's sister, if the patient is without pain medication and/or her anxiety medication for more than 12 hours, she starts to go through withdrawal, having nausea, vomiting, diarrhea and shaking. The patient's sister has been giving the patient her medications one day at a time, sometimes going to the patient's house more than once a day. The patient's sister reports that the patient cuts her pain medication in half, so she can take it more often and that she is often concerned that she will run out and have pain.(this includes the recnt trial of 12hr oxy) The sister would like the patient to have a gyn referral to address urinary frequency and pelvic pressure. She would also like the patient to have a prescription for a medication to treat diarrhea. The patient and her sister looked into seeing Dr. Jennelle Human who is a therapist but the out of pocket cost was prohibitive.  The patient's sister works for [employer] and is very busy with work currently. She reports that she is part of a support group for family members of mental health patients and this helps her cope and set limits. She also reports that she has a supportive group of friends.  Review of Systems    Objective:   Physical Exam   Assessment & Plan:  I have reviewed and agree with  documentation. I felt this intervention was appropriate as this sister has to control and dispense opiates for Daniyla to prevent overuse.  Robert P. Merla Riches, M.D.

## 2013-03-31 NOTE — Progress Notes (Signed)
Subjective:    Patient ID: Morgan Kelly, female    DOB: 1951-05-03, 62 y.o.   MRN: [id number]  HPI  Cervical spondylosis With facet arthropathy Chronic pain--complains that 30xr bid plus 20 q6h ineffective at current level of pain--says she takes as prescribed(see DG)  Liked all short acting better when had 30am and 30 hs with prn 15mg  but would take 120-130mg  at minimum daily AND would often run short  Post recent injury with hospitalization we are now in a differefnt situation with sister monitoring meds  Degenerative disc disease,and Fibromyalgia, also continue to be issues  Gyn/Gu issues--still searching for gyn  Dislocation of left thumb, sequela//fx femur---both are causing pain to be out of control  GAD (generalized anxiety disorder) Depression, major, recurrent Mood disorder PTSD  Her cluster of psych issues are preventing her from handling her other problems.  She says she has been unable to find a psychiatrist that accepts medicare  Continues to say she benefits from celexa  Says alpraz much more effective than ativan at same dose 1 mg  IBS (irritable bowel syndrome)--freq diarrhea that responds to lomotil. Exacerbated during opiate withdrawal, and periods of anger and anxiety  Need for prophylactic vaccination and inoculation against influenza - Plan: Flu Vaccine QUAD 36+ mos IM  Review of Systems  Constitutional: Negative for fever and unexpected weight change.  Eyes: Negative for visual disturbance.  Respiratory: Negative for cough and shortness of breath.        Off singulair 3 mos and still doing well--using advair  Cardiovascular: Negative for chest pain, palpitations and leg swelling.  Gastrointestinal: Positive for nausea, vomiting and diarrhea. Negative for abdominal pain and abdominal distention.       Refuses GI eval at this point and will not return to Dr Mann---gi sxt seem exac or caused by meds/psych issues  Genitourinary: Positive for frequency and  difficulty urinating.       Pelvic pressure  Skin: Negative for rash.  Psychiatric/Behavioral: Positive for sleep disturbance and dysphoric mood. Negative for hallucinations, confusion and self-injury. The patient is nervous/anxious.        Apparently unable to sleep this last 3 days til last pm--?? Sister is buying beer and cigs for her tho she hate to do so   Skipped PT today???felt too bad to go     Objective:   Physical Exam  Constitutional: She is oriented to person, place, and time. She appears well-developed and well-nourished.  Appearance is best today in many months--better groomed/brighter affect/no tears  Eyes: EOM are normal. Pupils are equal, round, and reactive to light.  Cardiovascular: Normal rate.   Pulmonary/Chest: Effort normal.  Neurological: She is alert and oriented to person, place, and time. No cranial nerve deficit.  Psychiatric:  Angry at being forced to get meds one day at a time Affect is uneven tho reaching out for some affirmation Thought content not appropriate-unwilling to admit med misuse,lack of control//judgement not enough to let ehr make decisions for self with regard to meds          Assessment & Plan:  Cervical spondylosis With facet arthropathy Chronic pain Degenerative disc disease, cervical/lumbar  fx femur Dislocation of left thumb, sequela  GAD (generalized anxiety disorder) Mood disorder Depression, major, recurrent PTSD Fibromyalgia Opiate misuse Alcohol use ? Misuse Nicotine use  IBS (irritable bowel syndrome)  Need for prophylactic vaccination and inoculation against influenza - Plan: Flu Vaccine QUAD 36+ mos IM   Best to move toward longacting meds  against her will with sister able to prevent overuse more easily Will call if not effective Pain management referral agreed to Psychiatry referral agreed to Gyn referral agreed to  Meds ordered this encounter  Medications  . ALPRAZolam (XANAX) 1 MG tablet    Sig:  she filled 10/7 to repl ativan  . diphenoxylate-atropine (LOMOTIL) 2.5-0.025 MG per tablet    Sig: Take 1 tablet by mouth 4 (four) times daily as needed. diarrhea    Dispense:  30 tablet    Refill:  0  . OxyCODONE HCl ER 60 MG T12A    Sig: Take 60 mg by mouth 2 (two) times daily. 12 hours apart    Dispense:  60 each    Refill:  0  . Oxycodone HCl 10 MG TABS    Sig: As needed for breakthrough pain once during the day and once during the night    Dispense:  60 tablet    Refill:  0

## 2013-04-06 ENCOUNTER — Telehealth: Payer: Self-pay

## 2013-04-06 NOTE — Telephone Encounter (Signed)
PT WOULD LIKE A CALL BACK FROM DR DOOLITTLE REGARDING HER MEDICATION. STATES THE OXYCODONE IS WORKING GOOD BUT THE OTHER ONE WITH THE 10MG S ISN'T HELPING AT ALL, NEED TO HAVE A HIGHER DOSAGE OF IT. PLEASE CALL [phone]

## 2013-04-06 NOTE — Telephone Encounter (Signed)
Current Outpatient Prescriptions  Medication Sig Dispense Refill  . levothyroxine (SYNTHROID, LEVOTHROID) 200 MCG tablet Take 1 tablet (200 mcg total) by mouth daily.  90 tablet  0  . lidocaine (LIDODERM) 5 % Place 3 patches onto the skin daily. Remove & Discard patch within 12 hours or as directed by MD  90 patch  5  . loratadine (CLARITIN) 10 MG tablet Take 10 mg by mouth daily as needed for allergies.       . Magnesium Hydroxide (MILK OF MAGNESIA PO) Take 5-10 mLs by mouth daily. Constipation      . Oxycodone HCl 10 MG TABS As needed for breakthrough pain once during the day and once during the night  60 tablet  0  . OxyCODONE HCl ER 60 MG T12A Take 60 mg by mouth 2 (two) times daily. 12 hours apart  60 each  0   Was given this last week---we need sister's help to change meds as she is dispensing We need sister's acknowledgement that she does need more meds Call her sister brenda--if she needs more for breakthru pain, then brenda can allow an extra 10 mg(or 1/2x 10) twice a day at same time as the other 10mg  tabs(she has enough to last til I return) I'll be out til next Tuesday

## 2013-04-07 ENCOUNTER — Telehealth: Payer: Self-pay | Admitting: Radiology

## 2013-04-07 NOTE — Telephone Encounter (Signed)
duplicate

## 2013-04-07 NOTE — Telephone Encounter (Addendum)
Called sister Steward Drone. Steward Drone indicates the current treatment is working "great" she is better on this. Patient did go for PT evaluation, and was told to take her medication 1 hour prior to PT. On the days of PT, she feels like she will do better if she gives her a 20 mg before PT. Steward Drone indicates she has enough medications  Pill count 60mg  46 pills  10mg  46 pills  20mg  27pills  30mg  8 pills Steward Drone will give her a 20 mg dose before physical therapy, advised her this is fine and thank you for letting us know.

## 2013-04-08 NOTE — Telephone Encounter (Signed)
thanks

## 2013-04-13 ENCOUNTER — Telehealth: Payer: Self-pay

## 2013-04-13 NOTE — Telephone Encounter (Signed)
Patient wants to know if she can have her Alprazolam dosage changed from 1 mg to 2 mg.   [phone]

## 2013-04-15 ENCOUNTER — Telehealth: Payer: Self-pay | Admitting: Radiology

## 2013-04-15 NOTE — Telephone Encounter (Signed)
When I spoke to patient today regarding her alprazolam, she asked why she did not get a call back about the increased Oxycodone. I advised her I called Steward Drone and she was to have increased dose before PT. Patient advised her sister has been giving her an increased dose of the Oxycodone, and this has been helpful. Patient upset / tearful because I called Steward Drone and did not call her back. I have apologized to [place] and told her I should have called her after I spoke to [place], but I did not. I have assured her this will not happen again, and apologized. I want you to know this happened. I should have called Malachi Bonds after I spoke to [place], but I did not. I apologize for this.

## 2013-04-15 NOTE — Telephone Encounter (Signed)
Good job!!

## 2013-04-15 NOTE — Telephone Encounter (Signed)
1mg  tid is max dose for her

## 2013-04-15 NOTE — Telephone Encounter (Signed)
Thanks I have called her to advise. She wants to know what she can do to help her sleep. Please advise.

## 2013-04-18 NOTE — Telephone Encounter (Signed)
Melatonin 3mg  or Valerian root 600 mg at bedtime

## 2013-04-19 NOTE — Telephone Encounter (Signed)
Voice mail not set-up yet.

## 2013-04-20 ENCOUNTER — Other Ambulatory Visit: Payer: Self-pay | Admitting: Physician Assistant

## 2013-04-20 NOTE — Telephone Encounter (Signed)
Called again. No answer/ still no voice mail set up.

## 2013-04-28 ENCOUNTER — Ambulatory Visit (INDEPENDENT_AMBULATORY_CARE_PROVIDER_SITE_OTHER): Payer: Medicare Other | Admitting: Internal Medicine

## 2013-04-28 ENCOUNTER — Encounter: Payer: Self-pay | Admitting: Internal Medicine

## 2013-04-28 VITALS — BP 120/60 | HR 65 | Temp 97.0°F | Resp 16 | Ht 60.5 in | Wt 111.2 lb

## 2013-04-28 DIAGNOSIS — F39 Unspecified mood [affective] disorder: Secondary | ICD-10-CM

## 2013-04-28 DIAGNOSIS — S93409A Sprain of unspecified ligament of unspecified ankle, initial encounter: Secondary | ICD-10-CM

## 2013-04-28 DIAGNOSIS — M5030 Other cervical disc degeneration, unspecified cervical region: Secondary | ICD-10-CM

## 2013-04-28 DIAGNOSIS — F411 Generalized anxiety disorder: Secondary | ICD-10-CM

## 2013-04-28 DIAGNOSIS — F339 Major depressive disorder, recurrent, unspecified: Secondary | ICD-10-CM

## 2013-04-28 DIAGNOSIS — M797 Fibromyalgia: Secondary | ICD-10-CM

## 2013-04-28 DIAGNOSIS — F4310 Post-traumatic stress disorder, unspecified: Secondary | ICD-10-CM

## 2013-04-28 DIAGNOSIS — IMO0001 Reserved for inherently not codable concepts without codable children: Secondary | ICD-10-CM

## 2013-04-28 DIAGNOSIS — G8929 Other chronic pain: Secondary | ICD-10-CM

## 2013-04-28 MED ORDER — OXYCODONE HCL 10 MG PO TABS: ORAL_TABLET | ORAL | Status: DC

## 2013-04-28 MED ORDER — ALPRAZOLAM 1 MG PO TABS: 1.0000 mg | ORAL_TABLET | Freq: Three times a day (TID) | ORAL | Status: DC | PRN

## 2013-04-28 MED ORDER — OXYCODONE HCL ER 60 MG PO T12A: 60.0000 mg | EXTENDED_RELEASE_TABLET | Freq: Two times a day (BID) | ORAL | Status: DC

## 2013-04-28 NOTE — Progress Notes (Signed)
Subjective:    Patient ID: Morgan Kelly, female    DOB: 22-Feb-1951, 62 y.o.   MRN: [id number]  HPI 2 weeks ago another episode of " syncope"---sister observed tonic movements-? length out=  This happened 36 hr after she ran out of xanax because she took more than prescribed and fits with the pattern of "seizures" noted at recent admission  Leg twisted and ankle injured -still swollen/has to limp  Sister is now giving one days meds for all meds at a time to prevent overuse Also notes memory issues over the last 3 months tho still basically lives alone without sister suspecting memory problems during daily visits  Pain still daily--annoying-interferes with thinking/wishes for more meds///referral being reviewed by Dr Schwartz/[facility]  130 to 111 since august-appetite worse//  Waiting on psychiatry appt 10/28 missed gyn appt to be rescheduled  Patient Active Problem List   Diagnosis Date Noted  . Seizures 02/18/2013  . Fracture of distal femur, left  01/15/2013  . Dislocation of left thumb, IP joint 01/15/2013  . Femur fracture 01/11/2013  . Raynaud's disease and hyperhidrosis resolved by sympathectomy 1990s 04/01/2012  . Cervical spondylosis With facet arthropathy 04/01/2012  . Degenerative disc disease, cervical 04/01/2012  . IBS (irritable bowel syndrome) 03/06/2012  . 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 Nicotine abuse 10/30/2011  . Chronic pain 09/05/2011  . Asthma in remission 09/05/2011  . Edema 09/05/2011  . GAD (generalized anxiety disorder) 09/05/2011  . Hypothyroid 09/05/2011  . Acne 09/05/2011  . Other and unspecified hyperlipidemia 09/05/2011  . Fibromyalgia 09/05/2011  . Eczema 09/05/2011  . Mood disorder 09/05/2011  . PTSD (post-traumatic stress disorder) 09/05/2011  . Depression, major, recurrent 09/05/2011  . Abnormal LFTs 09/05/2011   Current outpatient prescriptions:acetaminophen (TYLENOL) 500 MG tablet,  Take 1-2 tablets (500-1,000 mg total) by mouth every 6 (six) hours as needed for pain or fever., Disp: 30 tablet, Rfl: 0;   belladonna alk-PHENObarbital (DONNATAL) 16.2 MG tablet, Take 1 tablet by mouth every 8 (eight) hours as needed. , Disp: , Rfl: ;   citalopram (CELEXA) 40 MG tablet, Take 1 tablet (40 mg total) by mouth daily., Disp: 90 tablet, Rfl: 3 clindamycin (CLEOCIN T) 1 % lotion, Apply 1 application topically daily as needed. Apply sparingly and massage in., Disp: , Rfl: ;   diphenoxylate-atropine (LOMOTIL) 2.5-0.025 MG per tablet, Take 1 tablet by mouth 4 (four) times daily as needed. diarrhea, Disp: 30 tablet, Rfl: 0;   docusate sodium 100 MG CAPS, Take 100 mg by mouth 2 (two) times daily., Disp: 10 capsule, Rfl: 0 ferrous sulfate 325 (65 FE) MG tablet, Take 1 tablet (325 mg total) by mouth 3 (three) times daily after meals., Disp: 90 tablet, Rfl: 0;   Fluticasone-Salmeterol (ADVAIR) 250-50 MCG/DOSE AEPB, Inhale 1 puff into the lungs every 12 (twelve) hours., Disp: 60 each, Rfl: 5;   levothyroxine (SYNTHROID, LEVOTHROID) 200 MCG tablet, Take 1 tablet (200 mcg total) by mouth daily., Disp: 90 tablet, Rfl: 0 lidocaine (LIDODERM) 5 %, Place 3 patches onto the skin daily. Remove & Discard patch within 12 hours or as directed by MD, Disp: 90 patch, Rfl: 5;   loratadine (CLARITIN) 10 MG tablet, Take 10 mg by mouth daily as needed for allergies. , Disp: , Rfl: ;   Magnesium Hydroxide (MILK OF MAGNESIA PO), Take 5-10 mLs by mouth daily. Constipation, Disp: , Rfl:  methocarbamol (ROBAXIN) 500 MG tablet, Take 1-2 tablets (500-1,000 mg total) by mouth 4 (  four) times daily as needed (spasms)., Disp: 80 tablet, Rfl: 0;   Oxycodone HCl 10 MG TABS, As needed for breakthrough pain once during the day and once during the night, Disp: 60 tablet, Rfl: 0;   OxyCODONE HCl ER 60 MG T12A, Take 60 mg by mouth 2 (two) times daily. 12 hours apart, Disp: 60 each, Rfl: 0 PROAIR HFA 108 (90 BASE) MCG/ACT inhaler, USE  1 TO 2 PUFFS EVERY 4 TO 6 HOURS AS NEEDED., Disp: 8.5 g, Rfl: 5;   Sulfamethoxazole-Trimethoprim (SEPTRA DS PO), Take by mouth 2 (two) times daily before a meal., Disp: , Rfl: ;   ALPRAZolam (XANAX) 1 MG tablet, Take 1 tablet (1 mg total) by mouth 3 (three) times daily as needed for anxiety., Disp: 90 tablet, Rfl: 1;   montelukast (SINGULAIR) 10 MG tablet, Take 10 mg by mouth at bedtime., Disp: , Rfl:     Review of Systems  Constitutional: Positive for appetite change and unexpected weight change. Negative for fever and activity change.  Eyes: Negative for visual disturbance.  Respiratory: Negative for shortness of breath.   Cardiovascular: Negative for chest pain, palpitations and leg swelling.  Gastrointestinal: Negative for abdominal pain and blood in stool.  Genitourinary: Negative for difficulty urinating.  Neurological: Negative for headaches.  Psychiatric/Behavioral: Negative for suicidal ideas and self-injury.       Objective:   Physical Exam  Constitutional: She is oriented to person, place, and time. She appears well-developed and well-nourished. No distress.  Eyes: Conjunctivae and EOM are normal. Pupils are equal, round, and reactive to light.  Neck: Neck supple. No thyromegaly present.  Cardiovascular: Normal rate and regular rhythm.   Neurological: She is alert and oriented to person, place, and time. No cranial nerve deficit.  Psychiatric:  Mood is improved. She is more accepting of her sisters management of medications. She is beginning to recognize her sister's help and accept that compassion. She would still prefer more opiates and psychotropics. Borderline attempts at control are still present. No suicide ideation. Does not voice despair today. She remains so deeply mired in her psychological problems that she is oblivious to the world around her for the most part  L ankle swollen and tender over ATF lig with pain on invers and dorsiflex but no instability and no  tenderness over distal fibula        Assessment & Plan:  Chronic pain  Degenerative disc disease, cervical  Depression, major, recurrent  Fibromyalgia  GAD (generalized anxiety disorder)  Mood disorder  PTSD (post-traumatic stress disorder)  Sprain of ankle, unspecified site  Recent weight loss uncertain etiology  Meds ordered this encounter  Medications  . OxyCODONE HCl ER 60 MG T12A    Sig: Take 60 mg by mouth 2 (two) times daily. 12 hours apart    Dispense:  60 each    Refill:  0  . Oxycodone HCl 10 MG TABS    Sig: As needed for breakthrough pain once during the day and once during the night    Dispense:  60 tablet    Refill:  0  . ALPRAZolam (XANAX) 1 MG tablet    Sig: Take 1 tablet (1 mg total) by mouth 3 (three) times daily as needed for anxiety.    Dispense:  90 tablet    Refill:  1   F/u 1 month Gyn consult Pain mgmt consult Sister to continue med management

## 2013-04-28 NOTE — Telephone Encounter (Signed)
Patient hs not responded/ not answered. Not able to leave a voice mail

## 2013-05-11 ENCOUNTER — Telehealth: Payer: Self-pay

## 2013-05-12 ENCOUNTER — Ambulatory Visit (HOSPITAL_COMMUNITY)
Admission: RE | Admit: 2013-05-12 | Discharge: 2013-05-12 | Disposition: A | Discharge status: Home / Self Care | Payer: Medicare Other | Source: Ambulatory Visit | Attending: Internal Medicine | Admitting: Internal Medicine

## 2013-05-12 ENCOUNTER — Ambulatory Visit (INDEPENDENT_AMBULATORY_CARE_PROVIDER_SITE_OTHER): Payer: Medicare Other | Admitting: Internal Medicine

## 2013-05-12 ENCOUNTER — Encounter: Payer: Self-pay | Admitting: Internal Medicine

## 2013-05-12 ENCOUNTER — Other Ambulatory Visit (HOSPITAL_COMMUNITY): Payer: Self-pay | Admitting: Internal Medicine

## 2013-05-12 VITALS — BP 138/86 | HR 56 | Temp 97.6°F | Resp 16 | Ht 60.5 in | Wt 120.2 lb

## 2013-05-12 DIAGNOSIS — M79605 Pain in left leg: Secondary | ICD-10-CM

## 2013-05-12 DIAGNOSIS — M7989 Other specified soft tissue disorders: Secondary | ICD-10-CM | POA: Insufficient documentation

## 2013-05-12 DIAGNOSIS — M25562 Pain in left knee: Secondary | ICD-10-CM

## 2013-05-12 DIAGNOSIS — M79609 Pain in unspecified limb: Secondary | ICD-10-CM

## 2013-05-12 DIAGNOSIS — Z86711 Personal history of pulmonary embolism: Secondary | ICD-10-CM | POA: Insufficient documentation

## 2013-05-12 DIAGNOSIS — D6859 Other primary thrombophilia: Secondary | ICD-10-CM | POA: Insufficient documentation

## 2013-05-12 DIAGNOSIS — S72402A Unspecified fracture of lower end of left femur, initial encounter for closed fracture: Secondary | ICD-10-CM

## 2013-05-12 DIAGNOSIS — M5030 Other cervical disc degeneration, unspecified cervical region: Secondary | ICD-10-CM

## 2013-05-12 DIAGNOSIS — G8929 Other chronic pain: Secondary | ICD-10-CM

## 2013-05-12 DIAGNOSIS — F339 Major depressive disorder, recurrent, unspecified: Secondary | ICD-10-CM

## 2013-05-12 MED ORDER — OXYCODONE HCL 10 MG PO TABS: 10.0000 mg | ORAL_TABLET | Freq: Four times a day (QID) | ORAL | Status: DC | PRN

## 2013-05-12 NOTE — Progress Notes (Signed)
  Subjective:    Patient ID: Morgan Kelly, female    DOB: 1951/01/18, 62 y.o.   MRN: [id number]  HPI Very concerned about increased swelling L leg from knee to ankle. Has been doing PT for femur fx and dealing with ankle sprain on that side as well and feels that this amount of swelling is new over past 7 days-10 days. Having lots of pain medial L Knee with movement.  Pain meds leaving gaps in coverage and especially notes early awakening without breakthru available Cont w/ pain in all MS areas Awaiting pain consult Also aw psychiatry consult and gyn consult  Here with sis who controls meds  Review of Systems  Constitutional: Negative for fever and unexpected weight change.  Eyes: Negative for visual disturbance.  Respiratory: Negative for shortness of breath.   Cardiovascular: Negative for chest pain.  Neurological: Negative for headaches.  Psychiatric/Behavioral: Negative for suicidal ideas, hallucinations and confusion.       Objective:   Physical Exam BP 138/86  Pulse 56  Temp(Src) 97.6 F (36.4 C) (Oral)  Resp 16  Ht 5' 0.5" (1.537 m)  Wt 120 lb 3.2 oz (54.522 kg)  BMI 23.08 kg/m2  SpO2 97%  Edema 3+L knee to foot Pulses good/sens intact/homan's neg Does have tenderness in post thigh and popliteal without cord,defect or swelling  Mood is depressed at current level of dysfunction/affect very much in need of multiple layers of support from others but thought process devoid of any sense of responsibility for creating her current level of estrangement/isolation and multiple disabling things     Assessment & Plan:  Pain in joint, lower leg, left  Leg pain/swelling, left - Plan: Lower Extremity Venous Duplex Left  Chronic pain--will incr bkthru to qid prn Reck as sched 2 weeks  Degenerative disc disease, cervical  Depression, major, recurrent  Fracture of distal femur, left, closed, initial encounter  Leg swelling  Meds ordered this encounter   Medications  . Oxycodone HCl 10 MG TABS    Sig: Take 1 tablet (10 mg total) by mouth every 6 (six) hours as needed.    Dispense:  60 tablet    Refill:  0

## 2013-05-14 ENCOUNTER — Ambulatory Visit
Admission: RE | Admit: 2013-05-14 | Discharge: 2013-05-14 | Disposition: A | Discharge status: Home / Self Care | Payer: Medicare Other | Source: Ambulatory Visit | Attending: Orthopedic Surgery | Admitting: Orthopedic Surgery

## 2013-05-14 ENCOUNTER — Telehealth: Payer: Self-pay

## 2013-05-14 ENCOUNTER — Other Ambulatory Visit: Payer: Self-pay | Admitting: Orthopedic Surgery

## 2013-05-14 DIAGNOSIS — W19XXXA Unspecified fall, initial encounter: Secondary | ICD-10-CM

## 2013-05-14 NOTE — Telephone Encounter (Signed)
I do not see any record of calls.

## 2013-05-14 NOTE — Telephone Encounter (Signed)
Patient says someone called her and she is returning call.

## 2013-05-15 ENCOUNTER — Telehealth: Payer: Self-pay

## 2013-05-15 NOTE — Telephone Encounter (Signed)
Patient calling to update Dr. Merla Riches. States that she went to physical therapy and had a CT scan done as well. Would like a call back when those results come in.   [phone]

## 2013-05-21 ENCOUNTER — Telehealth: Payer: Self-pay

## 2013-05-21 DIAGNOSIS — F329 Major depressive disorder, single episode, unspecified: Secondary | ICD-10-CM

## 2013-05-21 MED ORDER — CITALOPRAM HYDROBROMIDE 40 MG PO TABS: 40.0000 mg | ORAL_TABLET | Freq: Every day | ORAL | Status: DC

## 2013-05-21 NOTE — Telephone Encounter (Signed)
Pended please advise.  

## 2013-05-21 NOTE — Telephone Encounter (Signed)
Pt is needing to get a few of her celexa before he appt with dr Merla Riches -she dropped her med box and lost those   Best number [phone]

## 2013-05-21 NOTE — Telephone Encounter (Signed)
Done

## 2013-05-23 NOTE — Telephone Encounter (Signed)
Called pt, VM not set up yet. 

## 2013-05-26 ENCOUNTER — Ambulatory Visit (INDEPENDENT_AMBULATORY_CARE_PROVIDER_SITE_OTHER): Payer: Medicare Other | Admitting: Internal Medicine

## 2013-05-26 VITALS — BP 115/60 | HR 62 | Temp 97.9°F | Resp 16 | Ht 61.0 in

## 2013-05-26 DIAGNOSIS — M47812 Spondylosis without myelopathy or radiculopathy, cervical region: Secondary | ICD-10-CM

## 2013-05-26 DIAGNOSIS — F4310 Post-traumatic stress disorder, unspecified: Secondary | ICD-10-CM

## 2013-05-26 DIAGNOSIS — F411 Generalized anxiety disorder: Secondary | ICD-10-CM

## 2013-05-26 DIAGNOSIS — F39 Unspecified mood [affective] disorder: Secondary | ICD-10-CM

## 2013-05-26 DIAGNOSIS — G8929 Other chronic pain: Secondary | ICD-10-CM

## 2013-05-26 DIAGNOSIS — F339 Major depressive disorder, recurrent, unspecified: Secondary | ICD-10-CM

## 2013-05-26 MED ORDER — OXYCODONE HCL ER 60 MG PO T12A: 60.0000 mg | EXTENDED_RELEASE_TABLET | Freq: Two times a day (BID) | ORAL | Status: DC

## 2013-05-26 MED ORDER — OXYCODONE HCL 10 MG PO TABS: 10.0000 mg | ORAL_TABLET | Freq: Four times a day (QID) | ORAL | Status: DC | PRN

## 2013-05-26 NOTE — Progress Notes (Signed)
Subjective:    Patient ID: Morgan Kelly, female    DOB: [birth year], 62 y.o.   MRN: [id number]  HPIf/u  Patient Active Problem List   Diagnosis Date Noted  . Mood disorder 09/05/2011    Priority: Medium  . Seizures 02/18/2013  . Fracture of distal femur, left ----see below 01/15/2013  . Dislocation of left thumb, IP joint 01/15/2013  . Femur fracture 01/11/2013  . Raynaud's disease and hyperhidrosis resolved by sympathectomy 1990s 04/01/2012  . Cervical spondylosis With facet arthropathy 04/01/2012  . Degenerative disc disease, cervical 04/01/2012  . IBS (irritable bowel syndrome) 03/06/2012  . 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 Nicotine abuse 10/30/2011  . Chronic pain 09/05/2011  . Asthma in remission 09/05/2011  . Edema 09/05/2011  . GAD (generalized anxiety disorder) 09/05/2011  . Hypothyroid 09/05/2011  . Acne 09/05/2011  . Other and unspecified hyperlipidemia 09/05/2011  . Fibromyalgia 09/05/2011  . Eczema 09/05/2011  . PTSD (post-traumatic stress disorder) 09/05/2011  . Depression, major, recurrent 09/05/2011  . Abnormal LFTs 09/05/2011  Current outpatient prescriptions:acetaminophen (TYLENOL) 500 MG tablet, Take 1-2 tablets (500-1,000 mg total) by mouth every 6 (six) hours as needed for pain or fever., Disp: 30 tablet, Rfl: 0;  ALPRAZolam (XANAX) 1 MG tablet, Take 1 tablet (1 mg total) by mouth 3 (three) times daily as needed for anxiety., Disp: 90 tablet, Rfl: 1 belladonna alk-PHENObarbital (DONNATAL) 16.2 MG tablet, Take 1 tablet by mouth every 8 (eight) hours as needed. , Disp: , Rfl: ;   citalopram (CELEXA) 40 MG tablet, Take 1 tablet (40 mg total) by mouth daily., Disp: 30 tablet, Rfl: 0;   clindamycin (CLEOCIN T) 1 % lotion, Apply 1 application topically daily as needed. Apply sparingly and massage in., Disp: , Rfl:  diphenoxylate-atropine (LOMOTIL) 2.5-0.025 MG per tablet, Take 1 tablet by mouth 4 (four) times daily as needed. diarrhea,  Disp: 30 tablet, Rfl: 0;   docusate sodium 100 MG CAPS, Take 100 mg by mouth 2 (two) times daily., Disp: 10 capsule, Rfl: 0;  ferrous sulfate 325 (65 FE) MG tablet, Take 1 tablet (325 mg total) by mouth 3 (three) times daily after meals., Disp: 90 tablet, Rfl: 0 Fluticasone-Salmeterol (ADVAIR) 250-50 MCG/DOSE AEPB, Inhale 1 puff into the lungs every 12 (twelve) hours., Disp: 60 each, Rfl: 5;   levothyroxine (SYNTHROID, LEVOTHROID) 200 MCG tablet, Take 1 tablet (200 mcg total) by mouth daily., Disp: 90 tablet, Rfl: 0;   lidocaine (LIDODERM) 5 %, Place 3 patches onto the skin daily. Remove & Discard patch within 12 hours or as directed by MD, Disp: 90 patch, Rfl: 5 loratadine (CLARITIN) 10 MG tablet, Take 10 mg by mouth daily as needed for allergies. , Disp: , Rfl: ;   Magnesium Hydroxide (MILK OF MAGNESIA PO), Take 5-10 mLs by mouth daily. Constipation, Disp: , Rfl: ;   methocarbamol (ROBAXIN) 500 MG tablet, Take 1-2 tablets (500-1,000 mg total) by mouth 4 (four) times daily as needed (spasms)., Disp: 80 tablet, Rfl: 0;   montelukast (SINGULAIR) 10 MG tablet, Take 10 mg by mouth at bedtime., Disp: , Rfl:  Oxycodone HCl 10 MG TABS, Take 1 tablet (10 mg total) by mouth every 6 (six) hours as needed., Disp: 60 tablet, Rfl: 0;   OxyCODONE HCl ER 60 MG T12A, Take 60 mg by mouth 2 (two) times daily. 12 hours apart, Disp: 60 each, Rfl: 0;   PROAIR HFA 108 (90 BASE) MCG/ACT inhaler, USE 1 TO 2 PUFFS EVERY 4  TO 6 HOURS AS NEEDED., Disp: 8.5 g, Rfl: 5;   Sulfamethoxazole-Trimethoprim (SEPTRA DS PO), Take by mouth 2 (two) times daily before a meal., Disp: , Rfl:     Status of knee by mri last week//starting elec stim-still lots of pain--says not relieved by meds tho sister who controls meds says they are doin g ok IMPRESSION: MRI last week There is distal lateral femoral orthopedic hardware transfixing an  ununited distal left femoral metaphyseal fracture which is in  anatomic alignment. There is mild callus  formation along the medial  fracture cleft.  Also c/o trouble with sis organizing inheitance and in her approach to life which G views as too spiritual--she wants her sis to go to couns with her or to mediation for decisions re future intears as we discuss this\no appts so far w/ psych or pain mgmt but in proicess Sleep still disrupted / by pain or by anxiety  Review of Systems  Constitutional: Positive for fatigue. Negative for fever, activity change, appetite change and unexpected weight change.  HENT: Negative for hearing loss and trouble swallowing.   Eyes: Negative for visual disturbance.  Respiratory: Negative for shortness of breath.   Cardiovascular: Negative for chest pain, palpitations and leg swelling.  Gastrointestinal:       Cont w/IBS sxtoms  Genitourinary: Negative for difficulty urinating.  Neurological: Negative for headaches.  Psychiatric/Behavioral: Negative for hallucinations and self-injury.       Sleep disrupted by pain and anxiety       Objective:   Physical Exam  Constitutional: She is oriented to person, place, and time. She appears distressed.  In tears/anxious/depressed  Eyes: Pupils are equal, round, and reactive to light.  Cardiovascular: Normal rate.   Pulmonary/Chest: Effort normal.  Neurological: She is alert and oriented to person, place, and time. No cranial nerve deficit.  Psychiatric:  She continues to be unable to see beyond her own view of life with no concern about her past treatment of sister who is now going beyond reasonable measures to rescue her sister          Assessment & Plan:  PTSD (post-traumatic stress disorder)  Mood disorder  GAD (generalized anxiety disorder)  Depression, major, recurrent  Cervical spondylosis With facet arthropathy  Chronic pain  Meds ordered this encounter  Medications  . Oxycodone HCl 10 MG TABS    Sig: Take 1 tablet (10 mg total) by mouth every 6 (six) hours as needed.    Dispense:  120  tablet    Refill:  0  . Oxycodone HCl 10 MG TABS    Sig: Take 1 tablet (10 mg total) by mouth every 6 (six) hours as needed. For 30 days from date signed    Dispense:  60 tablet    Refill:  0  . OxyCODONE HCl ER 60 MG T12A    Sig: Take 60 mg by mouth 2 (two) times daily. 12 hours apart. For 30 days from date signed.    Dispense:  60 each    Refill:  0  F/u 5 weeks may ref if needed for 12 hr awaiting eval by pain management/psychiatry, psychology

## 2013-06-01 ENCOUNTER — Telehealth: Payer: Self-pay

## 2013-06-01 MED ORDER — MONTELUKAST SODIUM 10 MG PO TABS: 10.0000 mg | ORAL_TABLET | Freq: Every day | ORAL | Status: DC

## 2013-06-01 NOTE — Telephone Encounter (Signed)
Ok to call in one month singulair w/ 2 ref

## 2013-06-01 NOTE — Telephone Encounter (Signed)
Patient states that she has "productive" cough and would like to go back on Singular. Patient states that she has been experiencing this cough for several days.  [phone]

## 2013-06-11 ENCOUNTER — Telehealth: Payer: Self-pay

## 2013-06-11 NOTE — Telephone Encounter (Signed)
In the dark sister won/t share info with her.   Wakes  Up screaming.  No car Wants referral to [phone]

## 2013-06-21 ENCOUNTER — Other Ambulatory Visit: Payer: Self-pay | Admitting: Physician Assistant

## 2013-06-25 ENCOUNTER — Telehealth: Payer: Self-pay

## 2013-06-25 NOTE — Telephone Encounter (Signed)
Ronalee Belts from [facility] called for clarification as to correct quantity of reg release oxycodone for pt. The original Rx written on 05/26/13 was for a quantity of #120, but the one to be filled 30 days from date written is for only #60 tablets with same sig. Dr Laney Pastor please advise. Ronalee Belts stated that we can just call him w/quantity change. We DON'T need to print new Rx and have pt p/up.

## 2013-06-28 NOTE — Telephone Encounter (Signed)
He is correct, my mistake, should be 120

## 2013-06-30 ENCOUNTER — Ambulatory Visit (INDEPENDENT_AMBULATORY_CARE_PROVIDER_SITE_OTHER): Payer: Medicare PPO | Admitting: Internal Medicine

## 2013-06-30 ENCOUNTER — Encounter: Payer: Self-pay | Admitting: Internal Medicine

## 2013-06-30 VITALS — BP 130/70 | HR 63 | Temp 98.2°F | Resp 16

## 2013-06-30 DIAGNOSIS — G8929 Other chronic pain: Secondary | ICD-10-CM

## 2013-06-30 DIAGNOSIS — M5030 Other cervical disc degeneration, unspecified cervical region: Secondary | ICD-10-CM

## 2013-06-30 DIAGNOSIS — M797 Fibromyalgia: Secondary | ICD-10-CM

## 2013-06-30 DIAGNOSIS — K589 Irritable bowel syndrome without diarrhea: Secondary | ICD-10-CM

## 2013-06-30 DIAGNOSIS — M47812 Spondylosis without myelopathy or radiculopathy, cervical region: Secondary | ICD-10-CM

## 2013-06-30 DIAGNOSIS — F4310 Post-traumatic stress disorder, unspecified: Secondary | ICD-10-CM

## 2013-06-30 DIAGNOSIS — F411 Generalized anxiety disorder: Secondary | ICD-10-CM

## 2013-06-30 DIAGNOSIS — F39 Unspecified mood [affective] disorder: Secondary | ICD-10-CM

## 2013-06-30 DIAGNOSIS — E039 Hypothyroidism, unspecified: Secondary | ICD-10-CM

## 2013-06-30 DIAGNOSIS — F339 Major depressive disorder, recurrent, unspecified: Secondary | ICD-10-CM

## 2013-06-30 DIAGNOSIS — IMO0001 Reserved for inherently not codable concepts without codable children: Secondary | ICD-10-CM

## 2013-06-30 MED ORDER — ZOSTER VACCINE LIVE 19400 UNT/0.65ML ~~LOC~~ SOLR: 0.6500 mL | Freq: Once | SUBCUTANEOUS | Status: DC

## 2013-06-30 MED ORDER — OXYCODONE HCL ER 60 MG PO T12A: 60.0000 mg | EXTENDED_RELEASE_TABLET | Freq: Two times a day (BID) | ORAL | Status: DC

## 2013-06-30 MED ORDER — OXYCODONE HCL 10 MG PO TABS: 10.0000 mg | ORAL_TABLET | Freq: Four times a day (QID) | ORAL | Status: DC | PRN

## 2013-06-30 MED ORDER — DIPHENOXYLATE-ATROPINE 2.5-0.025 MG PO TABS: 1.0000 | ORAL_TABLET | Freq: Four times a day (QID) | ORAL | Status: DC | PRN

## 2013-06-30 MED ORDER — ALPRAZOLAM 1 MG PO TABS: 1.0000 mg | ORAL_TABLET | Freq: Three times a day (TID) | ORAL | Status: DC | PRN

## 2013-06-30 MED ORDER — LEVOTHYROXINE SODIUM 200 MCG PO TABS: 200.0000 ug | ORAL_TABLET | Freq: Every day | ORAL | Status: DC

## 2013-06-30 NOTE — Progress Notes (Signed)
Mood disorder  PTSD (post-traumatic stress disorder)  IBS (irritable bowel syndrome)---lomotil occas since donnatal no longer avail  GAD (generalized anxiety disorder)---needs xanax refill  Fibromyalgia--- still has numerous muscle problems, fatigue,  Depression, major, recurrent--- no suicide ideation/seems more settled in current with sister monitoring meds/still has not made appointments with psychiatry or psychology  Degenerative disc disease, cervical--- still requires pain medicines for neck pain  Chronic pain--from spine disease and fibromyalgia/recent fracture of the femur  Cervical spondylosis With facet arthropathy  Current outpatient prescriptions:acetaminophen (TYLENOL) 500 MG tablet, Take 1-2 tablets (500-1,000 mg total) by mouth every 6 (six) hours as needed for pain or fever., Disp: 30 tablet, Rfl: 0;   ALPRAZolam (XANAX) 1 MG tablet, Take 1 tablet (1 mg total) by mouth 3 (three) times daily as needed for anxiety., Disp: 90 tablet, Rfl: 1;   citalopram (CELEXA) 40 MG tablet, TAKE 1 TABLET ONCE DAILY., Disp: 30 tablet, Rfl: 5 clindamycin (CLEOCIN T) 1 % lotion, Apply 1 application topically daily as needed. Apply sparingly and massage in., Disp: , Rfl: ;   diphenoxylate-atropine (LOMOTIL) 2.5-0.025 MG per tablet, Take 1 tablet by mouth 4 (four) times daily as needed. diarrhea, Disp: 30 tablet, Rfl: 0;   docusate sodium 100 MG CAPS, Take 100 mg by mouth 2 (two) times daily., Disp: 10 capsule,  Fluticasone-Salmeterol (ADVAIR) 250-50 MCG/DOSE AEPB, Inhale 1 puff into the lungs every 12 (twelve) hours.,  levothyroxine (SYNTHROID, LEVOTHROID) 200 MCG tablet, Take 1 tablet (200 mcg total) by mouth daily., Disp: 90 tablet, Rfl: 0;   lidocaine (LIDODERM) 5 %, Place 3 patches onto the skin daily. Remove & Discard patch within 12 hours or as directed by MD, Disp: 90 patch, Rfl: 5 loratadine (CLARITIN) 10 MG tablet, Take 10 mg by mouth daily as needed for allergies. ,   Magnesium  Hydroxide (MILK OF MAGNESIA PO), Take 5-10 mLs by mouth daily. Constipation,   montelukast (SINGULAIR) 10 MG tablet, Take 1 tablet (10 mg total) by mouth at bedtime.,  Oxycodone HCl 10 MG TABS, Take 1 tablet (10 mg total) by mouth every 6 (six) hours as needed., Disp: 120 tablet, Rfl: 0 Oxycodone HCl 10 MG TABS, Take 1 tablet (10 mg total) by mouth every 6 (six) hours as needed. For 30 days from date signed, Disp: 60 tablet, Rfl: 0;   OxyCODONE HCl ER 60 MG T12A, Take 60 mg by mouth 2 (two) times daily. 12 hours apart. For 30 days from date signed., Disp: 60 each, Rfl: 0;   PROAIR HFA 108 (90 BASE) MCG/ACT inhaler, USE 1 TO 2 PUFFS EVERY 4 TO 6 HOURS AS NEEDED., Disp: 8.5 g, Rfl: 5 Sulfamethoxazole-Trimethoprim (SEPTRA DS PO), Take by mouth 2 (two) times daily before a meal., Disp: , Rfl: ;   belladonna alk-PHENObarbital (DONNATAL) 16.2 MG tablet, Take 1 tablet by mouth every 8 (eight) hours as needed.   ferrous sulfate 325 (65 FE) MG tablet, Take 1 tablet (325 mg total) by mouth 3 (three) times daily after meals., Disp: 90 tablet, Rfl: 0 methocarbamol (ROBAXIN) 500 MG tablet, Take 1-2 tablets (500-1,000 mg total) by mouth 4 (four) times daily as needed (spasms)., Disp: 80 tablet, Rfl: 0;   montelukast (SINGULAIR) 10 MG tablet, Take 10 mg by mouth at bedtime., Disp: , Rfl:    Awaiting bone stimulator Dr Marcelino Scot  Physical Exam  Constitutional: She is oriented to person, place, and time. She appears well-developed and well-nourished. No distress.  Eyes: EOM are normal. Pupils are equal, round, and  reactive to light.  Neck: No thyromegaly present.  Cardiovascular: Normal rate and regular rhythm.   Pulmonary/Chest: Effort normal. No respiratory distress.  Neurological: She is oriented to person, place, and time. No cranial nerve deficit.  Psychiatric: Her behavior is normal. Thought content normal.  She is better than in recent months although her judgment is still clouded her numerous psychiatric  difficulties in her mood is not optimistic    Mood disorder  PTSD (post-traumatic stress disorder)  IBS (irritable bowel syndrome)  GAD (generalized anxiety disorder)  Fibromyalgia  Depression, major, recurrent  Degenerative disc disease, cervical  Chronic pain  Cervical spondylosis With facet arthropathy  Hypothyroid - Plan: levothyroxine (SYNTHROID, LEVOTHROID) 200 MCG tablet  Meds ordered this encounter  Medications  . zoster vaccine live, PF, (ZOSTAVAX) 70177 UNT/0.65ML injection    Sig: Inject 19,400 Units into the skin once. Administer at pharmacy    Dispense:  1 each    Refill:  0  . ALPRAZolam (XANAX) 1 MG tablet    Sig: Take 1 tablet (1 mg total) by mouth 3 (three) times daily as needed for anxiety. For 07/26/12    Dispense:  90 tablet    Refill:  1  . OxyCODONE HCl ER 60 MG T12A    Sig: Take 60 mg by mouth 2 (two) times daily. For 07/25/13 or after    Dispense:  60 each    Refill:  0  . Oxycodone HCl 10 MG TABS    Sig: Take 1 tablet (10 mg total) by mouth every 6 (six) hours as needed. For 07/25/13 or after    Dispense:  120 tablet    Refill:  0  . diphenoxylate-atropine (LOMOTIL) 2.5-0.025 MG per tablet    Sig: Take 1 tablet by mouth 4 (four) times daily as needed. diarrhea    Dispense:  30 tablet    Refill:  1  . levothyroxine (SYNTHROID, LEVOTHROID) 200 MCG tablet    Sig: Take 1 tablet (200 mcg total) by mouth daily.    Dispense:  90 tablet    Refill:  3   Gave the names of [facility]/ [facility] will try again to refer her to pain management

## 2013-07-01 ENCOUNTER — Other Ambulatory Visit: Payer: Self-pay | Admitting: *Deleted

## 2013-07-01 NOTE — Telephone Encounter (Signed)
Pt called needs refill

## 2013-07-02 NOTE — Telephone Encounter (Signed)
Pharm reported they filled w/correct #120 on 06/25/13.

## 2013-07-12 ENCOUNTER — Telehealth: Payer: Self-pay

## 2013-07-12 NOTE — Telephone Encounter (Signed)
Pt and [name] has two issues.    1.  Pt had dropped of letter stating:  Dr. Hardin Negus has a partner Dr. Naaman Plummer(?) at the pain clinic and she has been scheduled to see him on Feb 25th.  She can see Dr. Hardin Negus if she waits 2 weeks longer.  She will do what Dr. Laney Pastor prefers.  She has been waiting on an answer to this question for over a week.  2.  Pt oxycontin 60mg  tab is non-formulary and would like to know what we can do for her. Letter was in Dr. Marlaine Hind box.  I will take care of issue number 2 and call insurance to see if we can get prior approval for this medication.

## 2013-07-12 NOTE — Telephone Encounter (Signed)
Error

## 2013-07-12 NOTE — Telephone Encounter (Signed)
Per database patient had both oxycodone 10 mg and oxycodone 60 mg filled on 06/25/13. She should not be out. If she has an issue with it being non-formulary Dr. Laney Pastor can review this upon his return. Rx that he wrote on 1/7 is for fill after 2/1 so she should have enough pain medicine to wait for Dr. Hardin Negus if she prefers.   Please return message to Dr. Laney Pastor once spoken with patient.

## 2013-07-12 NOTE — Telephone Encounter (Signed)
Doolittle - Pt left a voice mail over the weekend on the appointment center's answering machine.  Says she has been trying to find out an answer to a question for two weeks.  Her sister brought by a letter also.  She did not give any other specifics. She was very upset that she has not heard anything.  Says to call "[name]", who is on her HIPAA, at [phone] or [phone] or [phone]

## 2013-07-13 NOTE — Telephone Encounter (Signed)
Hold for Dr Doolittle. 

## 2013-07-13 NOTE — Telephone Encounter (Signed)
[employer] will not cover her pain medication. She has a form in Dr. Marlaine Hind box. She is requesting this form be filled out so they can get an exception and the insurance company will cover her medications.

## 2013-07-15 NOTE — Telephone Encounter (Signed)
She should hold out to see dr Hardin Negus if possible---please call to tell her this/or her sister brenda

## 2013-07-16 ENCOUNTER — Encounter: Payer: Self-pay | Admitting: *Deleted

## 2013-07-17 NOTE — Telephone Encounter (Signed)
Pt.notified

## 2013-07-19 ENCOUNTER — Telehealth: Payer: Self-pay

## 2013-07-19 NOTE — Telephone Encounter (Signed)
Patient states that she is having her bone stimulator test done on Wednesday 07/21/2013 and she is going to see Dr. Hardin Negus on 09/29/2013.  [phone]

## 2013-07-21 ENCOUNTER — Other Ambulatory Visit: Payer: Self-pay | Admitting: Internal Medicine

## 2013-07-21 NOTE — Telephone Encounter (Signed)
I have spoken to patient today concerning the Oxycontin 60mg , this medication is not on formulary with [employer] she would be willing to try extended release Morphine , which is covered by the insurance company. She would prefer, however, to have short acting Oxycodone. She states the regular Oxycodone works better for her than the Oxycontin. Please advise.

## 2013-07-22 ENCOUNTER — Telehealth: Payer: Self-pay | Admitting: Radiology

## 2013-07-22 NOTE — Telephone Encounter (Signed)
Patient called again her medication, she will run out soon, insurance will not cover Oxycontin 60 mg, please advise.

## 2013-07-23 MED ORDER — OXYCODONE HCL 30 MG PO TABS: 30.0000 mg | ORAL_TABLET | Freq: Four times a day (QID) | ORAL | Status: DC

## 2013-07-23 NOTE — Telephone Encounter (Signed)
Pt advised.

## 2013-07-23 NOTE — Telephone Encounter (Signed)
Insurance will no longer cover long-acting oxycodone Her only alternative is long-acting morphine or short acting oxycodone It is preferable not to change medicine categories so we will use short acting oxycodone Meds ordered this encounter  Medications  . oxycodone (ROXICODONE) 30 MG immediate release tablet    Sig: Take 1 tablet (30 mg total) by mouth every 6 (six) hours.    Dispense:  120 tablet    Refill:  0   she already has a prescription to be filled on February 1 for 10 mg oxycodone to be used for breakthrough pain every 6 hours as needed

## 2013-07-29 ENCOUNTER — Telehealth: Payer: Self-pay

## 2013-07-29 NOTE — Telephone Encounter (Signed)
PT STATES HER SISTER WILL BE BRINGING IN A LETTER IN A PURPLE ENVELOPE FOR DR DOOLITTLE TO READ BEFORE HER APPT WITH HIM NEXT WEEK AND SHE WOULD LIKE A CALL WHENEVER SHE DROPS IT OFF. [place]

## 2013-08-04 ENCOUNTER — Ambulatory Visit (INDEPENDENT_AMBULATORY_CARE_PROVIDER_SITE_OTHER): Payer: Medicare PPO | Admitting: Internal Medicine

## 2013-08-04 ENCOUNTER — Encounter: Payer: Self-pay | Admitting: Internal Medicine

## 2013-08-04 ENCOUNTER — Telehealth: Payer: Self-pay | Admitting: Radiology

## 2013-08-04 VITALS — BP 140/72 | HR 88 | Temp 98.1°F | Resp 18

## 2013-08-04 DIAGNOSIS — M25529 Pain in unspecified elbow: Secondary | ICD-10-CM

## 2013-08-04 DIAGNOSIS — F39 Unspecified mood [affective] disorder: Secondary | ICD-10-CM

## 2013-08-04 DIAGNOSIS — M797 Fibromyalgia: Secondary | ICD-10-CM

## 2013-08-04 DIAGNOSIS — G8929 Other chronic pain: Secondary | ICD-10-CM

## 2013-08-04 DIAGNOSIS — IMO0001 Reserved for inherently not codable concepts without codable children: Secondary | ICD-10-CM

## 2013-08-04 MED ORDER — OXYCODONE HCL 30 MG PO TABS: 30.0000 mg | ORAL_TABLET | Freq: Four times a day (QID) | ORAL | Status: DC

## 2013-08-04 MED ORDER — OXYCODONE HCL 10 MG PO TABS: 10.0000 mg | ORAL_TABLET | Freq: Four times a day (QID) | ORAL | Status: DC | PRN

## 2013-08-04 NOTE — Progress Notes (Addendum)
Subjective:    Patient ID: Morgan Kelly, female    DOB: 09-Jun-1951, 63 y.o.   MRN: [id number] This chart was scribed for Tami Lin, MD by Anastasia Pall, ED Scribe. This patient was seen in room 25 and the patient's care was started at 10:09 AM.  Chief Complaint  Patient presents with   Follow-up    has seen Dr Marcelino Scot has new bone stimulator   Pain    chronic pain has appt with pain management   right shoulder/ upper arm pain    states worse past few months, cane increases pain   HPI Morgan Kelly is a 63 y.o. female Pt presents for follow-up of her h/o chronic pain.   She reports constant, deep, aching pain in her right shoulder over the last few months. She reports ambulating with a cane exacerbates her right arm pain. She also reports a rash over her right arm she states is unrelated to her arm pain.   She reports she has been going to physical therapy 2x a week. She has seen Dr. Marcelino Scot and has been given new bone stimulator. She reports having an appointment 09/29/13 with Dr. Hardin Negus, pain management.   She reports waking up every night for 1-2 hours. She reports needing more sleep and is frustrated with being interrupted 2-3 times a day by her sister /care giver visits to deliver her medication. She wants a pain management plan with less visits. She insists that she can manage one week at a time. Her sister reports that she frequently loses medication and has to have an extra dose delivered to get through the night. Her sister is concerned about her memory in the aftermath of the seizure she had. [name] is unwilling to have a neurological consult about her memory issues saying that she has none.  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 Authorizing Provider  acetaminophen (TYLENOL) 500 MG tablet Take 1-2 tablets (500-1,000 mg total) by mouth every 6 (six) hours as needed for pain or fever. 01/18/13   Jari Pigg, PA-C  ALPRAZolam Duanne Moron) 1 MG tablet Take 1 tablet (1 mg total) by mouth 3 (three) times daily as needed for anxiety. For 07/26/12 06/30/13   Leandrew Koyanagi, MD  citalopram (CELEXA) 40 MG tablet TAKE 1 TABLET ONCE DAILY. 06/21/13   Leandrew Koyanagi, MD  clindamycin (CLEOCIN T) 1 % lotion Apply 1 application topically daily as needed. Apply sparingly and massage in.    Historical Provider, MD  diphenoxylate-atropine (LOMOTIL) 2.5-0.025  MG per tablet Take 1 tablet by mouth 4 (four) times daily as needed. diarrhea 06/30/13   Leandrew Koyanagi, MD  docusate sodium 100 MG CAPS Take 100 mg by mouth 2 (two) times daily. 01/18/13   Jari Pigg, PA-C  ferrous sulfate 325 (65 FE) MG tablet Take 1 tablet (325 mg total) by mouth 3 (three) times daily after meals. 01/18/13   Jari Pigg, PA-C  Fluticasone-Salmeterol (ADVAIR) 250-50 MCG/DOSE AEPB Inhale 1 puff into the lungs every 12 (twelve) hours. 01/23/13   Leandrew Koyanagi, MD    levothyroxine (SYNTHROID, LEVOTHROID) 200 MCG tablet Take 1 tablet (200 mcg total) by mouth daily. 06/30/13   Leandrew Koyanagi, MD  lidocaine (LIDODERM) 5 % Place 3 patches onto the skin daily. Remove & Discard patch within 12 hours or as directed by MD 01/23/13   Leandrew Koyanagi, MD  loratadine (CLARITIN) 10 MG tablet Take 10 mg by mouth daily as needed for allergies.     Historical Provider, MD  Magnesium Hydroxide (MILK OF MAGNESIA PO) Take 5-10 mLs by mouth daily. Constipation    Historical Provider, MD  montelukast (SINGULAIR) 10 MG tablet Take 1 tablet (10 mg total) by mouth at bedtime. 06/01/13   Leandrew Koyanagi, MD  oxycodone (ROXICODONE) 30 MG immediate release tablet Take 1 tablet (30 mg total) by mouth every 6 (six) hours. 07/23/13   Leandrew Koyanagi, MD  Oxycodone HCl 10 MG TABS Take 1 tablet (10 mg total) by mouth every 6 (six) hours as needed. For 07/25/13 or after 06/30/13   Leandrew Koyanagi, MD  PROAIR HFA 108 (90 BASE) MCG/ACT inhaler USE 1 TO 2 PUFFS EVERY 4 TO 6 HOURS AS NEEDED. 04/20/13   Leandrew Koyanagi, MD  Sulfamethoxazole-Trimethoprim [facility] DS PO) Take by mouth 2 (two) times daily before a meal.    Historical Provider, MD  zoster vaccine live, PF, (ZOSTAVAX) 29937 UNT/0.65ML injection Inject 19,400 Units into the skin once. Administer at pharmacy 06/30/13   Leandrew Koyanagi, MD   Review of Systems  Constitutional: Negative for fever.  Musculoskeletal: Positive for arthralgias (right arm, bilateral wrist).  Skin: Positive for rash (over right upper arm).  Psychiatric/Behavioral: Positive for sleep disturbance.      Objective:   Physical Exam Nursing note and vitals reviewed. Constitutional: Pt is oriented to person, place, and time. Pt appears well-developed and well-nourished. No distress.  HENT:  Head: Normocephalic and atraumatic.  Eyes: EOM are normal. Pupils are equal, round, and reactive to light.  Neck: Neck supple.  Cardiovascular: Normal  rate. Pulmonary/Chest: Effort normal. No respiratory distress.  Abdominal:  Musculoskeletal: Normal range of motion. Right upper arm has no swelling, tenderness.  Neurological: Pt is alert and oriented to person, place, and time.  Skin: Skin is warm and dry.  Psychiatric: Pt has her usual distorted mood and affect. She is obviously frustrated with the pain from her femur fracture .Pt's behavior is dominated by her desire to have more control in her life despite the fact that the last time she had control of her medications she overused and ended up having severe withdrawals with seizures resulting in hospitalization. Her sister's hoping we can prevent that from recurring   BP 140/72   Pulse 88   Temp(Src) 98.1 F (36.7 C)   Resp 18   SpO2 94%     Assessment & Plan:  Chronic pain--- at her request to try to prove that she is able to manage  her medications her sister will allow her to have  one day of meds at time rather than delivering them every 12 hours--may progress to 2 days or 3 days at a time  if successful/appointment with pain management Dr. Hardin Negus in April  Fibromyalgia  Mood disorder--she continues to struggle with psychological issues but is adamant about refusing psychiatry and further  counseling/in the paper chart his evaluation by psychologist Dionisio David documenting multiple issues and the  unlikelihood that treatment will be very successful  Pain right upper arm-unclear etiology/sounds like neuralgia and may be related to degenerative disc disease cervical  to continue stretching neck area to improve flexibility  Leg pain post fracture-this continues to push her pain medication requirements-hopefully bone stimulator will  help/physical therapy is very difficult creating more pain requiring pain meds after therapy   Meds ordered this encounter  Medications   Oxycodone HCl 10 MG TABS    Sig: Take 1 tablet (10 mg total) by mouth every 6 (six) hours as needed. For 08/22/13  or after    Dispense:  120 tablet    Refill:  0   oxycodone (ROXICODONE) 30 MG immediate release tablet    Sig: Take 1 tablet (30 mg total) by mouth every 6 (six) hours. For 3/1 or after    Dispense:  120 tablet    Refill:  0   Followup 4-6 weeks I have completed the patient encounter in its entirety as documented by the scribe, with editing by me where necessary. Robert P. Laney Pastor, M.D.

## 2013-08-04 NOTE — Telephone Encounter (Signed)
Your schedule is full on March 25th, patient can not come in on March 31st, and she can not wait until April 7th, can I schedule her to come in at 10am on March 25th.

## 2013-08-04 NOTE — Telephone Encounter (Signed)
sure

## 2013-08-05 NOTE — Telephone Encounter (Signed)
Morgan Kelly will you schedule this? I can not over book.

## 2013-08-06 NOTE — Telephone Encounter (Signed)
Done made appointment for 3/25 @ 10am

## 2013-08-15 NOTE — Telephone Encounter (Signed)
PATIENT IS REQUESTING A HIPPAA FORM BE MAILED TO HER SO SHE CAN READ IT BEFORE WEDNESDAYS APPOINTMENT WITH DR. Laney Pastor.   SHE IS ALSO, REQUESTING A COPY OF ANY INFORMATION WE HAVE FROM DR. HINNEY'S OFFICE.  STATES SHE HAS COMPLETED A RELEASE FORM FOR THIS INFORMATION.  SHE CAN PICK THIS UP ON Wednesday.

## 2013-08-16 NOTE — Telephone Encounter (Signed)
Will mail release form. Not sure if she will have it before her appt. Also, no signed release on file requesting records regarding Dr Lennox Solders office. She needs to sign a release if she wants those records.

## 2013-08-18 ENCOUNTER — Encounter: Payer: Self-pay | Admitting: Internal Medicine

## 2013-08-18 ENCOUNTER — Ambulatory Visit (INDEPENDENT_AMBULATORY_CARE_PROVIDER_SITE_OTHER): Payer: Medicare PPO | Admitting: Internal Medicine

## 2013-08-18 VITALS — BP 98/61 | HR 88 | Temp 98.2°F | Resp 16 | Ht 61.0 in | Wt 126.2 lb

## 2013-08-18 DIAGNOSIS — M5030 Other cervical disc degeneration, unspecified cervical region: Secondary | ICD-10-CM

## 2013-08-18 DIAGNOSIS — G8929 Other chronic pain: Secondary | ICD-10-CM

## 2013-08-18 DIAGNOSIS — S7290XA Unspecified fracture of unspecified femur, initial encounter for closed fracture: Secondary | ICD-10-CM

## 2013-08-18 DIAGNOSIS — F411 Generalized anxiety disorder: Secondary | ICD-10-CM

## 2013-08-18 DIAGNOSIS — F4310 Post-traumatic stress disorder, unspecified: Secondary | ICD-10-CM

## 2013-08-18 DIAGNOSIS — F339 Major depressive disorder, recurrent, unspecified: Secondary | ICD-10-CM

## 2013-08-18 DIAGNOSIS — M797 Fibromyalgia: Secondary | ICD-10-CM

## 2013-08-18 DIAGNOSIS — F39 Unspecified mood [affective] disorder: Secondary | ICD-10-CM

## 2013-08-18 NOTE — Progress Notes (Signed)
Subjective:    Patient ID: Morgan Kelly, female    DOB: August 04, 1950, 63 y.o.   MRN: [id number] This chart was scribed for Leandrew Koyanagi, MD by Rolanda Lundborg, ED Scribe. This patient was seen in room 25 and the patient's care was started at 10:13 AM.  Chief Complaint  Patient presents with   med check    HPI HPI Comments: Morgan Kelly is a 63 y.o. female who presents to the [facility] for a medication check. She was seen by her gynecologist yesterday and had a normal exam, waiting on pap results after no care for many years!!!  [facility] issues: She reports feeling depressed because all her money is spent on medical bills and she may not be able to buy a car. Feels apprehensive at limited mobility. Anxiety only moderate last 2-3 weeks. Sleep a little better. Has been successful at managing meds for 24-48 hrs.   [facility] FEMUR: She is down to doing PT 1x per week (was going 2x). She states she has the equipment and instructions needed to continue her PT at home. She is trying to walk a couple times per day.    She states she has not been able to get her shingles vaccine because multiple places are out of stock.  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 11/27/2011   Nicotine abuse 10/30/2011   Chronic pain 09/05/2011   Asthma in remission 09/05/2011   Edema 09/05/2011   GAD (generalized anxiety disorder) 09/05/2011   Hypothyroid 09/05/2011   Acne 09/05/2011     Other and unspecified hyperlipidemia 09/05/2011   Fibromyalgia 09/05/2011   Eczema 09/05/2011   Mood disorder 09/05/2011   PTSD (post-traumatic stress disorder) 09/05/2011   Depression, major, recurrent 09/05/2011   Abnormal LFTs 09/05/2011    Current Outpatient Prescriptions on File Prior to Visit  Medication Sig Dispense Refill   acetaminophen (TYLENOL) 500 MG tablet Take 1-2 tablets (500-1,000 mg total) by mouth every 6 (six) hours as needed for pain or fever.  30 tablet  0   ALPRAZolam (XANAX) 1 MG tablet Take 1 tablet (1 mg total) by mouth 3 (three) times daily as needed for anxiety. For 07/26/12  90 tablet  1   citalopram (CELEXA) 40 MG tablet TAKE 1 TABLET ONCE DAILY.  30 tablet  5   clindamycin (CLEOCIN T) 1 % lotion Apply 1 application topically daily as needed. Apply sparingly and massage in.       diphenoxylate-atropine (LOMOTIL) 2.5-0.025 MG per tablet Take 1 tablet by mouth 4 (four) times daily as needed. diarrhea  30 tablet  1   docusate sodium 100 MG CAPS Take 100 mg by mouth 2 (two) times daily.  10 capsule  0   ferrous sulfate 325 (65 FE) MG tablet Take 1 tablet (325 mg total) by mouth 3 (three) times daily after meals.  90 tablet  0   Fluticasone-Salmeterol (ADVAIR) 250-50 MCG/DOSE AEPB Inhale 1 puff into the lungs every 12 (twelve) hours.  60 each  5   levothyroxine (SYNTHROID, LEVOTHROID) 200 MCG tablet Take 1 tablet (200 mcg total) by mouth daily.  90 tablet  3   lidocaine (LIDODERM) 5 % Place 3 patches onto the skin daily. Remove & Discard patch within 12 hours or as directed by MD  90 patch  5   loratadine (CLARITIN) 10 MG tablet Take 10 mg by mouth daily as needed for allergies.        Magnesium Hydroxide (MILK OF MAGNESIA PO) Take 5-10 mLs by mouth daily. Constipation       montelukast (SINGULAIR) 10 MG tablet Take 1 tablet (10 mg total) by mouth at bedtime.  30 tablet  2   oxycodone (ROXICODONE) 30 MG immediate release tablet Take 1 tablet (30  mg total) by mouth every 6 (six) hours. For 3/1 or after  120 tablet  0   Oxycodone HCl 10 MG TABS Take 1 tablet (10 mg total) by mouth every 6 (six) hours as needed. For 08/22/13 or after  120 tablet  0   PROAIR HFA 108 (90 BASE) MCG/ACT inhaler USE 1 TO 2 PUFFS EVERY 4 TO 6 HOURS AS NEEDED.  8.5 g  5   Sulfamethoxazole-Trimethoprim (SEPTRA DS PO) Take by mouth 2 (two) times daily before a meal.       No current facility-administered medications on file prior to visit.       Review of Systems  Constitutional: Negative for fever, appetite change and unexpected weight change.  Eyes: Negative for visual disturbance.  Respiratory: Negative for shortness of breath.   Cardiovascular: Negative for chest pain, palpitations and leg swelling.  Gastrointestinal: Negative for abdominal pain.  Genitourinary: Negative for difficulty urinating.  Neurological: Negative for speech difficulty and headaches.  Psychiatric/Behavioral: Negative for suicidal ideas, hallucinations, confusion, decreased concentration and agitation.       Objective:   Physical Exam  Nursing note and vitals reviewed. Constitutional: She is oriented to person, place, and time. She appears well-developed and well-nourished. No distress.  HENT:  Head: Normocephalic and atraumatic.  Eyes: EOM are normal. Pupils are equal, round, and reactive to light.  Neck: Neck supple.  Cardiovascular: Normal rate.   Pulmonary/Chest: Effort normal. No respiratory distress.  Musculoskeletal: Normal range of motion.  Neurological: She is alert and oriented to person, place, and time.  Skin: Skin is warm and dry.  Psychiatric: She has a normal mood and affect. Her behavior is normal.    Filed Vitals:   08/18/13 1018  BP: 98/61  Pulse: 88  Temp: 98.2 F (36.8 C)  TempSrc: Oral  Resp: 16  Height: 5\' 1"  (1.549 m)  Weight: 126 lb 3.2 oz (57.244 kg)  SpO2: 94%         Assessment & Plan:  Mood disorder  Chronic  pain  Degenerative disc disease, cervical  Depression, major, recurrent  Femur fracture  Fibromyalgia  GAD (generalized anxiety disorder)  PTSD (post-traumatic stress disorder)  F/u 1 month---may refill meds if needed before then by phone  Awaiting appt April Dr Hardin Negus PM Awaiting time to schedule w/ Psychiatyr Dr Clovis Pu w/ possible therapy w/ [facility] thru that office      I have completed the patient encounter in its entirety as documented by the scribe, with editing by me where necessary. Robert P. Laney Pastor, M.D.

## 2013-09-03 ENCOUNTER — Telehealth: Payer: Self-pay

## 2013-09-03 NOTE — Telephone Encounter (Signed)
Dr.Doolittle Pt would like to speak with you regarding seeing a psychiatrist. She would only like to speak with only Dr.Doolittle. [facility]:[phone]

## 2013-09-05 NOTE — Telephone Encounter (Signed)
LMOM to CB. 

## 2013-09-06 NOTE — Telephone Encounter (Signed)
Pt called back and spoke with andre, stated to remove message from the pool, she will speak to dr Laney Pastor at her next appointment.

## 2013-09-06 NOTE — Telephone Encounter (Signed)
Left message for pt to call back with any question she may have for dr Laney Pastor.

## 2013-09-15 ENCOUNTER — Other Ambulatory Visit: Payer: Self-pay | Admitting: Radiology

## 2013-09-15 ENCOUNTER — Ambulatory Visit (INDEPENDENT_AMBULATORY_CARE_PROVIDER_SITE_OTHER): Payer: Medicare PPO | Admitting: Internal Medicine

## 2013-09-15 ENCOUNTER — Telehealth: Payer: Self-pay | Admitting: Family Medicine

## 2013-09-15 VITALS — BP 126/60 | HR 87 | Temp 98.3°F | Resp 16 | Ht 61.0 in | Wt 125.0 lb

## 2013-09-15 DIAGNOSIS — F39 Unspecified mood [affective] disorder: Secondary | ICD-10-CM

## 2013-09-15 DIAGNOSIS — L709 Acne, unspecified: Secondary | ICD-10-CM

## 2013-09-15 DIAGNOSIS — G8929 Other chronic pain: Secondary | ICD-10-CM

## 2013-09-15 DIAGNOSIS — IMO0001 Reserved for inherently not codable concepts without codable children: Secondary | ICD-10-CM

## 2013-09-15 DIAGNOSIS — L708 Other acne: Secondary | ICD-10-CM

## 2013-09-15 DIAGNOSIS — F4310 Post-traumatic stress disorder, unspecified: Secondary | ICD-10-CM

## 2013-09-15 DIAGNOSIS — M47812 Spondylosis without myelopathy or radiculopathy, cervical region: Secondary | ICD-10-CM

## 2013-09-15 DIAGNOSIS — M5030 Other cervical disc degeneration, unspecified cervical region: Secondary | ICD-10-CM

## 2013-09-15 DIAGNOSIS — F339 Major depressive disorder, recurrent, unspecified: Secondary | ICD-10-CM

## 2013-09-15 DIAGNOSIS — M797 Fibromyalgia: Secondary | ICD-10-CM

## 2013-09-15 DIAGNOSIS — S72409A Unspecified fracture of lower end of unspecified femur, initial encounter for closed fracture: Secondary | ICD-10-CM

## 2013-09-15 DIAGNOSIS — F411 Generalized anxiety disorder: Secondary | ICD-10-CM

## 2013-09-15 MED ORDER — DOXYCYCLINE HYCLATE 100 MG PO TABS: 100.0000 mg | ORAL_TABLET | Freq: Every day | ORAL | Status: DC

## 2013-09-15 MED ORDER — OXYCODONE HCL 10 MG PO TABS: 10.0000 mg | ORAL_TABLET | Freq: Four times a day (QID) | ORAL | Status: DC | PRN

## 2013-09-15 MED ORDER — ALPRAZOLAM 1 MG PO TABS: 1.0000 mg | ORAL_TABLET | Freq: Three times a day (TID) | ORAL | Status: DC | PRN

## 2013-09-15 MED ORDER — OXYCODONE HCL 30 MG PO TABS: 30.0000 mg | ORAL_TABLET | Freq: Four times a day (QID) | ORAL | Status: DC

## 2013-09-15 NOTE — Telephone Encounter (Signed)
Patient indicates you forgot to give her Alprazolam, looks like she is due for refill on April 2nd, pended.

## 2013-09-15 NOTE — Telephone Encounter (Signed)
Spoke with patient and let her know prescription is ready.  Will fax it over to [facility].

## 2013-09-15 NOTE — Progress Notes (Signed)
Subjective:    Patient ID: Morgan Kelly, female    DOB: 1950-10-16, 63 y.o.   MRN: [id number]  HPI Cystic acne not resp to septra--not taking spironolactone tho has had this for months//using cleocin  anx much worse w/ turmoil over control of life/struggles w/ sis over money, transpor despi 3 xanax a day-- buspar bad side effects/tried wellbutr,elavil,other ssris,other antidepr She is frustrated tho has been reluctant to start therapy again--or psychiatry---putting off appts because she has too many other appt  pain mgmt appt in April--still using freq doses and needs transition to longacting if possible///still not happy with pain control Apparently allowing 2d at a time did not result in overuse(sister still in control)  She indicates disability has her listed as being Bipolar tho I am not sure where that came from---see prior psych eval-Dr Morgan Kelly   Review of Systems  Constitutional: Negative for fever, appetite change and unexpected weight change.  HENT: Negative for trouble swallowing.   Respiratory: Negative for shortness of breath.   Cardiovascular: Negative for chest pain, palpitations and leg swelling.  Gastrointestinal: Negative for abdominal pain.  Genitourinary: Negative for difficulty urinating.  Musculoskeletal:       About at end of PT and to f/u Dr Morgan Kelly soon  Neurological: Negative for headaches.  Hematological: Does not bruise/bleed easily.  Psychiatric/Behavioral: Negative for suicidal ideas and self-injury.       Sleep remains an issue/daily organization lacking/purpose missing/soc life nil/feels she is being denied a car and a life by sister who "hides everything" from her but I stressed how she is not ready or able to care for self at this point and must show great improvement before this can happen       Objective:   Physical Exam BP 126/60  Pulse 87  Temp(Src) 98.3 F (36.8 C)  Resp 16  Ht 5\' 1"  (1.549 m)  Wt 125 lb (56.7 kg)  BMI 23.63 kg/m2   SpO2 96% PERRLA Ht reg Mood is "tears" and frustrations--affect is upset but not out of touch Thought contest colored by this but no flights of ideas or delusions More willing to discuss future for 1st time Mild facial and back acne--no inflam papules visible       Assessment & Plan:  Acne--chg to doxy/cont cleo/restart spiron  Cervical spondylosis With facet arthropathy Chronic pain Degenerative disc disease, cervical Fracture of distal femur, left  PT/Pain Med/PM consult  Depression, major, recurrent Mood disorder Fibromyalgia GAD (generalized anxiety disorder) PTSD (post-traumatic stress disorder)  Needs long term counsel  Meds ordered this encounter  Medications  . doxycycline (VIBRA-TABS) 100 MG tablet    Sig: Take 1 tablet (100 mg total) by mouth daily.    Dispense:  30 tablet    Refill:  5  . Oxycodone HCl 10 MG TABS    Sig: Take 1 tablet (10 mg total) by mouth every 6 (six) hours as needed. For 09/21/13 or after    Dispense:  120 tablet    Refill:  0  . oxycodone (ROXICODONE) 30 MG immediate release tablet    Sig: Take 1 tablet (30 mg total) by mouth every 6 (six) hours. For 3/31 or after    Dispense:  120 tablet    Refill:  0   Patient Instructions  Ok to liberalize the number of days for pain meds if everything is going well--try 5 days at a time at first  Investigate "uber" so you can have transportation when needed--done by credit  card-very safe and easy--needs APP on cellphone so may need some further education on this---online instruction available at [place] activities---like senior center//seminars at downtown bookstore on [address]//starting own PT by joining the downtown [employer] with iron only supplement needed/Valerian root 600mg  may be a supplement that may help anxiety  After pain management you want to pursue counseling/psychiatry next and may want Morgan Kelly to go with you when the counselor feels that is  appropriate   F/u 1 mo

## 2013-09-15 NOTE — Patient Instructions (Signed)
Ok to liberalize the number of days for pain meds if everything is going well--try 5 days at a time at first  Investigate "uber" so you can have transportation when needed--done by credit card-very safe and easy--needs APP on cellphone so may need some further education on this---online instruction available at [place] activities---like senior center//seminars at downtown bookstore on [address]//starting own PT by joining the downtown [employer] with iron only supplement needed/Valerian root 600mg  may be a supplement that may help anxiety  After pain management you want to pursue counseling/psychiatry next and may want [name] to go with you when the counselor feels that is appropriate

## 2013-09-23 ENCOUNTER — Telehealth: Payer: Self-pay | Admitting: *Deleted

## 2013-09-23 NOTE — Telephone Encounter (Signed)
Dr. Laney Pastor,  Pt called concerned about her bi-polar dx. She has a few questions about that. Also, she wants to know if she still needs to see Dr. Clovis Pu.  Please call [phone]

## 2013-09-24 NOTE — Telephone Encounter (Signed)
Spoke with patient and she said that Dr. Laney Pastor wanted her to see pain management before she sees Dr. Clovis Pu. Her appt with Dr. Hardin Negus is Wednesday 09/29/13 and she can not afford copay's for both appt. She was wanting to know if she could cxl appt with Dr. Hardin Negus on Wednesday and see Dr. Clovis Pu first? I told her to keep appt with Dr. Hardin Negus for now call Dr. Casimiro Needle office Monday to see when they can schedule her. It might be enough time in between that she would be able to see both since not sure how far out her appt would be. She will call Dr. Casimiro Needle office on Monday and see when they can see her and she will call us with that information. If they are able to see her soon do you think she should see Dr. Hardin Negus first or Dr. Clovis Pu?

## 2013-09-24 NOTE — Telephone Encounter (Signed)
She needs to see Dr Hardin Negus!!!!! She can delay Psychiatry until later!!!

## 2013-09-25 NOTE — Telephone Encounter (Signed)
Patient states she would like to set up an appt with  Dr. Clovis Pu immediately. She states she cannot see both doctors due to financial reasons. She states she needs to see someone that she can talk to.

## 2013-09-25 NOTE — Telephone Encounter (Signed)
Spoke with pt. Will call Dr. Hardin Negus on Monday and reschedule the appt for a month out because she is "very bad" mentally and really needs to see Dr. Clovis Pu soon. She will call us back and let us know when her new appt is set up

## 2013-09-27 NOTE — Telephone Encounter (Signed)
Patient cancelled appointment with Dr. Hardin Negus.   Dr. Beryle Quant' office is working on her insurance to determine coverage.

## 2013-09-27 NOTE — Telephone Encounter (Signed)
Left message to return call. Why didn't she keep appt to see Dr. Hardin Negus until she heard back from Dr. Casimiro Needle office

## 2013-09-28 NOTE — Telephone Encounter (Signed)
Pt called back and stated that she [facility] has approved coverage and that she will be calling today.  She is rescheduling her appt with Dr Hardin Negus.  She states that she needs to se a psychologist first.

## 2013-10-13 ENCOUNTER — Ambulatory Visit (INDEPENDENT_AMBULATORY_CARE_PROVIDER_SITE_OTHER): Payer: Medicare PPO | Admitting: Internal Medicine

## 2013-10-13 VITALS — BP 104/66 | HR 87 | Temp 97.8°F | Resp 16 | Ht 61.0 in | Wt 127.0 lb

## 2013-10-13 DIAGNOSIS — G8929 Other chronic pain: Secondary | ICD-10-CM

## 2013-10-13 DIAGNOSIS — F411 Generalized anxiety disorder: Secondary | ICD-10-CM

## 2013-10-13 DIAGNOSIS — F339 Major depressive disorder, recurrent, unspecified: Secondary | ICD-10-CM

## 2013-10-13 MED ORDER — OXYCODONE HCL 10 MG PO TABS: 10.0000 mg | ORAL_TABLET | Freq: Four times a day (QID) | ORAL | Status: DC | PRN

## 2013-10-13 MED ORDER — OXYCODONE HCL 30 MG PO TABS: 30.0000 mg | ORAL_TABLET | Freq: Four times a day (QID) | ORAL | Status: DC

## 2013-10-13 NOTE — Progress Notes (Addendum)
This chart was scribed for Leandrew Koyanagi, MD by Elby Beck, Scribe. This patient was seen in room 27 and the patient's care was started at 11:09 AM.   Subjective:    Patient ID: Eli Phillips, female    DOB: 12-11-1950, 63 y.o.   MRN: [id number]  HPI  HPI Comments: TYREESHA MAHARAJ is a 63 y.o. female with a history of chronic pain, cervical degenerative disc disease and cervical spondylosis with facet arthropathy who presents to [facility] wanting to discuss options for treating her chronic pain and options regarding an appointment with a Specialist to be evaluated for Bipolar disorder. She states that the only medication she is on currently is Celexa, which she states that she has been on "for years". She states that she has an appointment next week with a Psychiatrist (Dr. Clovis Pu) for evaluation of a multitude of psychological disorders, and to potentially begin on a new medication. She states that she also wants to follow-up with pain management, but not until she is first evaluated for Bipolar. She expresses that she does not want to start on both behavioral and pain medications at the same time, because she believes that would be too complicated. She states that she has been walking 3 times a day to try to help with her chronic pain. She reports ambulating with a cane at baseline. She states that she has been seen by Physical therapy as much as is allowed by Medicare. She states that she does not need any medication refills. She has a past medical history of major recurrent depression, PTSD and mood disorder. She states that she has neither a car, nor a computer, and she expresses concern that she wants more freedom from sister's caretaking    Patient Active Problem List   Diagnosis Date Noted  . Seizures 02/18/2013  . Fracture of distal femur, left  01/15/2013  . Dislocation of left thumb, IP joint 01/15/2013  . Femur fracture 01/11/2013  . Raynaud's  disease and hyperhidrosis resolved by sympathectomy 1990s 04/01/2012  . Cervical spondylosis With facet arthropathy 04/01/2012  . Degenerative disc disease, cervical 04/01/2012  . IBS (irritable bowel syndrome) 03/06/2012  . 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 Nicotine abuse 10/30/2011  . Chronic pain 09/05/2011  . Asthma in remission 09/05/2011  . Edema 09/05/2011  . GAD (generalized anxiety disorder) 09/05/2011  . Hypothyroid 09/05/2011  . Acne 09/05/2011  . Other and unspecified hyperlipidemia 09/05/2011  . Fibromyalgia 09/05/2011  . Eczema 09/05/2011  . Mood disorder 09/05/2011  . PTSD (post-traumatic stress disorder) 09/05/2011  . Depression, major, recurrent 09/05/2011  . Abnormal LFTs 09/05/2011    Review of Systems     Objective:   Physical Exam  Nursing note and vitals reviewed. Constitutional: She is oriented to person, place, and time. She appears well-developed and well-nourished. No distress.  HENT:  Head: Normocephalic and atraumatic.  Eyes: EOM are normal.  Neck: Neck supple. No tracheal deviation present.  Cardiovascular: Normal rate.   Pulmonary/Chest: Effort normal. No respiratory distress.  Musculoskeletal: Normal range of motion.  Neurological: She is alert and oriented to person, place, and time.  Skin: Skin is warm and dry.  Psychiatric: She has a normal mood and affect. Her behavior is normal.       BP 104/66  Pulse 87  Temp(Src) 97.8 F (36.6 C)  Resp 16  Ht 5\' 1"  (1.549 m)  Wt 127 lb (57.607 kg)  BMI 24.01  kg/m2  SpO2 98% Assessment & Plan:  Chronic pain  GAD (generalized anxiety disorder)  Depression, major, recurrent  Meds ordered this encounter  Medications  . oxycodone (ROXICODONE) 30 MG immediate release tablet    Sig: Take 1 tablet (30 mg total) by mouth every 6 (six) hours. For 4/30 or after    Dispense:  120 tablet    Refill:  0  . Oxycodone HCl 10 MG TABS    Sig: Take 1 tablet (10 mg total) by  mouth every 6 (six) hours as needed. For 10/21/13 or after    Dispense:  120 tablet    Refill:  0      Note to Dr Clovis Pu I have completed the patient encounter in its entirety as documented by the scribe, with editing by me where necessary. Lamira Borin P. Laney Pastor, M.D.

## 2013-10-18 ENCOUNTER — Telehealth: Payer: Self-pay

## 2013-10-18 NOTE — Telephone Encounter (Signed)
DOOLITTLE - Pt was suppose to see Dr. Lawernce Keas tomorrow, but because she is on Medicare she will have to see a PA.  She wants you to know where she is "approaching this from".  She is starting "anew" and she will not do "their homework".  Wants to also make sure we sent all her medical records.  [phone]

## 2013-10-19 NOTE — Telephone Encounter (Signed)
LM for pt to rtn call. 

## 2013-10-19 NOTE — Telephone Encounter (Signed)
Morgan Kelly called back and she is not pleased with the referral we gave her. She states she was not able to speak to Morgan Kelly. She saw a PA. She said she was not comfortable with "re-hashing" her past with a perfect stranger. She did not make a second appt.   Advised pt we did send her records over to the office.   Pt has an appt in 3 weeks with Morgan Kelly. She is going to wait till that appt to talk to him in person about what to do next.

## 2013-10-20 ENCOUNTER — Telehealth: Payer: Self-pay
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
## 2013-10-21 ENCOUNTER — Other Ambulatory Visit: Payer: Self-pay | Admitting: Internal Medicine

## 2013-10-21 NOTE — Telephone Encounter (Signed)
LM on cell phone.

## 2013-10-21 NOTE — Telephone Encounter (Signed)
Dr Laney Pastor, you have Rxd this for pt in the past (for control of acne) but wasn't discussed or on med list at 10/13/13 OV. Do you want to give RFs?

## 2013-10-21 NOTE — Telephone Encounter (Signed)
Pt wants to mover her appt up with Dr. Laney Pastor.

## 2013-10-25 ENCOUNTER — Telehealth: Payer: Self-pay
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
## 2013-10-27 ENCOUNTER — Telehealth: Payer: Self-pay

## 2013-10-27 NOTE — Telephone Encounter (Signed)
PA needed for Lidoderm patches. Completed form on covermymeds.

## 2013-10-29 NOTE — Telephone Encounter (Signed)
PA was denied d/t not being covered for pt's Dxs. Notified pharmacy. Dr Laney Pastor, do you want to Rx anything else that pt is not already taking?

## 2013-10-29 NOTE — Telephone Encounter (Signed)
Patient wants dr. Laney Pastor to know her insurance company will not allow long term pain meds to be covered by insurance until October 2015.   [phone]

## 2013-11-01 NOTE — Telephone Encounter (Signed)
Ok--I need a copy of the eval from [facility]--should not be an issue since I referred her--please call them

## 2013-11-02 ENCOUNTER — Telehealth: Payer: Self-pay

## 2013-11-02 NOTE — Telephone Encounter (Signed)
Pt called in regards to her appt. With Dr.Doolittle on Wednesday 5/13. She would like him to be aware that she will be brining her sister ( her power of attorney), and needs him to "firmly" advice her of the pt's condition/ needs  [phone]

## 2013-11-02 NOTE — Telephone Encounter (Signed)
Spoke to [place] at [facility].  She will fax the evaluation.

## 2013-11-17 ENCOUNTER — Ambulatory Visit (INDEPENDENT_AMBULATORY_CARE_PROVIDER_SITE_OTHER): Payer: Medicare PPO | Admitting: Internal Medicine

## 2013-11-17 ENCOUNTER — Encounter: Payer: Self-pay | Admitting: Internal Medicine

## 2013-11-17 VITALS — BP 101/55 | HR 74 | Temp 97.6°F | Resp 16 | Ht 61.0 in | Wt 128.0 lb

## 2013-11-17 DIAGNOSIS — F411 Generalized anxiety disorder: Secondary | ICD-10-CM

## 2013-11-17 DIAGNOSIS — F39 Unspecified mood [affective] disorder: Secondary | ICD-10-CM

## 2013-11-17 DIAGNOSIS — F339 Major depressive disorder, recurrent, unspecified: Secondary | ICD-10-CM

## 2013-11-17 DIAGNOSIS — G8929 Other chronic pain: Secondary | ICD-10-CM

## 2013-11-17 MED ORDER — OXYCODONE HCL 10 MG PO TABS: 10.0000 mg | ORAL_TABLET | Freq: Four times a day (QID) | ORAL | Status: DC | PRN

## 2013-11-17 MED ORDER — OXYCODONE HCL 30 MG PO TABS: 30.0000 mg | ORAL_TABLET | Freq: Four times a day (QID) | ORAL | Status: DC

## 2013-11-17 MED ORDER — ALPRAZOLAM 1 MG PO TABS: 1.0000 mg | ORAL_TABLET | Freq: Three times a day (TID) | ORAL | Status: DC | PRN
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
## 2013-11-24 NOTE — Telephone Encounter (Signed)
Diamond called, she was very concerned about, "the memory problems she has been having." She has an appt. With Dr. Laney Pastor at the end of the month, but would like to speak with him or his nurse about her symptoms, and a possible Ct scan.

## 2013-11-25 ENCOUNTER — Telehealth: Payer: Self-pay

## 2013-11-25 NOTE — Telephone Encounter (Signed)
Spoke to pt. Advised her that if she needed to be seen sooner pt needed to come to 102. Gave her the next few days Dr. Laney Pastor will be here. She states that she does not like being in a crowd of people.

## 2013-11-25 NOTE — Telephone Encounter (Signed)
Pt states she has to make list for everything. She would like to move her appt. Transferred to billing.
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
## 2013-11-28 ENCOUNTER — Other Ambulatory Visit: Payer: Self-pay | Admitting: Internal Medicine

## 2013-11-30 ENCOUNTER — Telehealth: Payer: Self-pay

## 2013-11-30 NOTE — Telephone Encounter (Signed)
DOOLITTLE - PT SAYS PHARMACY HAD REQUESTED A REFILL ON LOMOTIL, BUT SHE FOUND A SECOND BOTTLE SHE HAD WITH SOME LEFT IN IT.  SO SHE SAYS TO JUST DISREGARD ANY REQUEST SENT IN AND THAT SHE HAS ENOUGH UNTIL SHE SEES YOU AGAIN.

## 2013-12-30 ENCOUNTER — Telehealth: Payer: Self-pay

## 2013-12-30 NOTE — Telephone Encounter (Signed)
PATIENT WANTS TO LET DR Laney Pastor KNOW THAT SHE IS HAVING DIFFICULTY REMEMBERING THINGS. SHE WOULD ALSO LIKE TO LET DR Laney Pastor KNOW THAT SHE IS UNHAPPY WITH HER CARE FROM DR Jannifer Franklin AND REFUSES TO SEE HIM AGAIN

## 2014-01-05 ENCOUNTER — Ambulatory Visit (INDEPENDENT_AMBULATORY_CARE_PROVIDER_SITE_OTHER): Payer: Medicare PPO | Admitting: Internal Medicine

## 2014-01-05 ENCOUNTER — Encounter: Payer: Self-pay | Admitting: Internal Medicine

## 2014-01-05 VITALS — BP 106/60 | HR 68 | Temp 98.6°F | Resp 16 | Ht 61.0 in | Wt 120.6 lb

## 2014-01-05 DIAGNOSIS — F3341 Major depressive disorder, recurrent, in partial remission: Secondary | ICD-10-CM

## 2014-01-05 DIAGNOSIS — IMO0002 Reserved for concepts with insufficient information to code with codable children: Secondary | ICD-10-CM

## 2014-01-05 DIAGNOSIS — F411 Generalized anxiety disorder: Secondary | ICD-10-CM

## 2014-01-05 DIAGNOSIS — F4310 Post-traumatic stress disorder, unspecified: Secondary | ICD-10-CM

## 2014-01-05 DIAGNOSIS — G8929 Other chronic pain: Secondary | ICD-10-CM

## 2014-01-05 DIAGNOSIS — F39 Unspecified mood [affective] disorder: Secondary | ICD-10-CM

## 2014-01-05 MED ORDER — FLUTICASONE-SALMETEROL 250-50 MCG/DOSE IN AEPB: 1.0000 | INHALATION_SPRAY | Freq: Two times a day (BID) | RESPIRATORY_TRACT | Status: DC

## 2014-01-05 MED ORDER — ALPRAZOLAM 1 MG PO TABS: 1.0000 mg | ORAL_TABLET | Freq: Three times a day (TID) | ORAL | Status: DC | PRN

## 2014-01-05 MED ORDER — OXYCODONE HCL 10 MG PO TABS: 10.0000 mg | ORAL_TABLET | Freq: Four times a day (QID) | ORAL | Status: DC | PRN

## 2014-01-05 MED ORDER — MONTELUKAST SODIUM 10 MG PO TABS: 10.0000 mg | ORAL_TABLET | Freq: Every day | ORAL | Status: AC

## 2014-01-05 MED ORDER — OXYCODONE HCL 30 MG PO TABS: 30.0000 mg | ORAL_TABLET | Freq: Four times a day (QID) | ORAL | Status: DC

## 2014-01-05 MED ORDER — CITALOPRAM HYDROBROMIDE 40 MG PO TABS: ORAL_TABLET | ORAL | Status: AC

## 2014-01-05 MED ORDER — CLINDAMYCIN PHOSPHATE 1 % EX LOTN: 1.0000 "application " | TOPICAL_LOTION | Freq: Every day | CUTANEOUS | Status: DC | PRN

## 2014-01-05 NOTE — Progress Notes (Signed)
Subjective:    Patient ID: Morgan Kelly, female    DOB: Aug 04, 1950, 63 y.o.   MRN: [id number]  HPIf/u Not c/o probs today Has meds thru 7/27 and is OK Sister caretaking of meds w/ no issues this visit Patient Active Problem List   Diagnosis Date Noted  . Chronic pain---r and l sciatica most disabling tho trigger points are active 09/05/2011    Priority: Medium  . GAD (generalized anxiety disorder) 09/05/2011    Priority: Medium---still lots of stress w/ relat w/ sis  . Mood disorder 09/05/2011    Priority: Medium  . PTSD (post-traumatic stress disorder) 09/05/2011    Priority: Medium  . Depression, major, recurrent 09/05/2011    Priority: Medium---feels very depressed at state of life as in past but has not established an appt with counseling--she has my recommendations  . Seizures---???? 02/18/2013  . Fracture of distal femur, left  01/15/2013  . Dislocation of left thumb, IP joint 01/15/2013  . Femur fracture---getting around better//still brace and PT 01/11/2013  . Raynaud's disease and hyperhidrosis resolved by sympathectomy 1990s 04/01/2012  . Cervical spondylosis With facet arthropathy 04/01/2012  . Degenerative disc disease, cervical 04/01/2012  . IBS (irritable bowel syndrome) 03/06/2012  . 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 Nicotine abuse 10/30/2011  . Asthma in remission---needs refills---ok but exacerbated during summer winds 09/05/2011  . Edema 09/05/2011  . Hypothyroid 09/05/2011  . Acne---cleocin controlling 09/05/2011  . Other and unspecified hyperlipidemia 09/05/2011  . Fibromyalgia 09/05/2011  . Eczema 09/05/2011  . Abnormal LFTs 09/05/2011   Prior to Admission medications   Medication Sig Start Date End Date Taking? Authorizing Provider  acetaminophen (TYLENOL) 500 MG tablet Take 1-2 tablets (500-1,000 mg total) by mouth every 6 (six) hours as needed for pain or fever. 01/18/13  Yes Jari Pigg, PA-C  ALPRAZolam Duanne Moron) 1 MG  tablet Take 1 tablet (1 mg total) by mouth 3 (three) times daily as needed for anxiety. 11/17/13  Yes Leandrew Koyanagi, MD  citalopram (CELEXA) 40 MG tablet TAKE 1 TABLET ONCE DAILY. 06/21/13  Yes Leandrew Koyanagi, MD  clindamycin (CLEOCIN T) 1 % lotion Apply 1 application topically daily as needed. Apply sparingly and massage in.   Yes Historical Provider, MD  diphenoxylate-atropine (LOMOTIL) 2.5-0.025 MG per tablet Take 1 tablet by mouth 4 (four) times daily as needed. diarrhea 06/30/13  Yes Leandrew Koyanagi, MD  docusate sodium 100 MG CAPS Take 100 mg by mouth 2 (two) times daily. 01/18/13  Yes Jari Pigg, PA-C  doxycycline (VIBRA-TABS) 100 MG tablet Take 1 tablet (100 mg total) by mouth daily. 09/15/13  Yes Leandrew Koyanagi, MD  Fluticasone-Salmeterol (ADVAIR) 250-50 MCG/DOSE AEPB Inhale 1 puff into the lungs every 12 (twelve) hours. 01/23/13  Yes Leandrew Koyanagi, MD  levothyroxine (SYNTHROID, LEVOTHROID) 200 MCG tablet Take 1 tablet (200 mcg total) by mouth daily. 06/30/13  Yes Leandrew Koyanagi, MD  lidocaine (LIDODERM) 5 % Place 3 patches onto the skin daily. Remove & Discard patch within 12 hours or as directed by MD 01/23/13  Yes Leandrew Koyanagi, MD  loratadine (CLARITIN) 10 MG tablet Take 10 mg by mouth daily as needed for allergies.    Yes Historical Provider, MD  Magnesium Hydroxide (MILK OF MAGNESIA PO) Take 5-10 mLs by mouth daily. Constipation   Yes Historical Provider, MD  montelukast (SINGULAIR) 10 MG tablet Take 1 tablet (10 mg total) by mouth at bedtime. 06/01/13  Yes Herbie Baltimore  Burtis Junes, MD  oxycodone (ROXICODONE) 30 MG immediate release tablet Take 1 tablet (30 mg total) by mouth every 6 (six) hours. 11/17/13  Yes Leandrew Koyanagi, MD  oxycodone (ROXICODONE) 30 MG immediate release tablet Take 1 tablet (30 mg total) by mouth every 6 (six) hours. For 30 days after date signed 11/17/13  Yes Leandrew Koyanagi, MD  Oxycodone HCl 10 MG TABS Take 1 tablet (10 mg total) by mouth  every 6 (six) hours as needed. 11/17/13  Yes Leandrew Koyanagi, MD  Oxycodone HCl 10 MG TABS Take 1 tablet (10 mg total) by mouth every 6 (six) hours as needed. For 30 days after date signed 11/17/13  Yes Leandrew Koyanagi, MD  PROAIR HFA 108 [phone] BASE) MCG/ACT inhaler USE 1 TO 2 PUFFS EVERY 4 TO 6 HOURS AS NEEDED. 04/20/13  Yes Leandrew Koyanagi, MD  spironolactone (ALDACTONE) 100 MG tablet TAKE 1 TABLET ONCE DAILY. USE AS NEEDED   Yes Leandrew Koyanagi, MD  ferrous sulfate 325 (65 FE) MG tablet Take 1 tablet (325 mg total) by mouth 3 (three) times daily after meals. 01/18/13   Jari Pigg, PA-C   See note re memory and referral to Dr Lowell Guitar declines this tho still worries re memory    Review of Systems  Constitutional: Negative for fever, activity change, appetite change and unexpected weight change.  HENT: Negative for trouble swallowing.   Eyes: Negative for visual disturbance.  Respiratory: Negative for shortness of breath.   Cardiovascular: Negative for chest pain, palpitations and leg swelling.  Gastrointestinal: Negative for abdominal pain.  Genitourinary: Negative for difficulty urinating.  Musculoskeletal: Positive for back pain, myalgias and neck pain.  Neurological: Negative for tremors, syncope, speech difficulty and headaches.  Psychiatric/Behavioral: Positive for dysphoric mood. Negative for confusion, sleep disturbance and self-injury. The patient is nervous/anxious.        Objective:   Physical Exam  Nursing note and vitals reviewed. Constitutional: She is oriented to person, place, and time. She appears well-developed and well-nourished. No distress.  Still walking w/ cane  HENT:  Head: Normocephalic and atraumatic.  Eyes: Pupils are equal, round, and reactive to light.  Neck: Normal range of motion.  Cardiovascular: Normal rate and regular rhythm.   Pulmonary/Chest: Effort normal. No respiratory distress.  Musculoskeletal: She exhibits no edema.    Neurological: She is alert and oriented to person, place, and time. No cranial nerve deficit.  Skin: Skin is warm and dry.  Psychiatric:  No tears//not anxious//c/o depr mood tho better today   BP 106/60  Pulse 68  Temp(Src) 98.6 F (37 C) (Oral)  Resp 16  Ht 5\' 1"  (1.549 m)  Wt 120 lb 9.6 oz (54.704 kg)  BMI 22.80 kg/m2  SpO2 95%      Assessment & Plan:  Chronic pain  Major depressive disorder, recurrent episode, in partial remission  GAD (generalized anxiety disorder)  Mood disorder  PTSD (post-traumatic stress disorder)  Meds ordered this encounter  Medications  . montelukast (SINGULAIR) 10 MG tablet    Sig: Take 1 tablet (10 mg total) by mouth at bedtime.    Dispense:  30 tablet    Refill:  11  . clindamycin (CLEOCIN T) 1 % lotion    Sig: Apply 1 application topically daily as needed. Apply sparingly and massage in.    Dispense:  60 mL    Refill:  5  . Fluticasone-Salmeterol (ADVAIR) 250-50 MCG/DOSE AEPB    Sig: Inhale 1 puff  into the lungs every 12 (twelve) hours.    Dispense:  60 each    Refill:  5  . oxycodone (ROXICODONE) 30 MG immediate release tablet    Sig: Take 1 tablet (30 mg total) by mouth every 6 (six) hours. For 02/16/14 or after    Dispense:  120 tablet    Refill:  0  . oxycodone (ROXICODONE) 30 MG immediate release tablet    Sig: Take 1 tablet (30 mg total) by mouth every 6 (six) hours. For 01/16/14 or after    Dispense:  120 tablet    Refill:  0  . Oxycodone HCl 10 MG TABS    Sig: Take 1 tablet (10 mg total) by mouth every 6 (six) hours as needed. For 02/16/14 or after    Dispense:  120 tablet    Refill:  0  . Oxycodone HCl 10 MG TABS    Sig: Take 1 tablet (10 mg total) by mouth every 6 (six) hours as needed. For 01/16/14 or after    Dispense:  120 tablet    Refill:  0  . ALPRAZolam (XANAX) 1 MG tablet    Sig: Take 1 tablet (1 mg total) by mouth 3 (three) times daily as needed for anxiety.    Dispense:  90 tablet    Refill:  1  .  citalopram (CELEXA) 40 MG tablet    Sig: TAKE 1 TABLET ONCE DAILY.    Dispense:  30 tablet    Refill:  5   F/u 2 mos   Included names for

## 2014-03-02 ENCOUNTER — Other Ambulatory Visit: Payer: Self-pay | Admitting: Internal Medicine

## 2014-03-04 NOTE — Telephone Encounter (Signed)
Called in.

## 2014-03-09 ENCOUNTER — Ambulatory Visit (INDEPENDENT_AMBULATORY_CARE_PROVIDER_SITE_OTHER): Payer: Medicare PPO | Admitting: Internal Medicine

## 2014-03-09 ENCOUNTER — Encounter: Payer: Self-pay | Admitting: Internal Medicine

## 2014-03-09 VITALS — BP 106/66 | HR 98 | Temp 98.2°F | Resp 16 | Ht 61.0 in | Wt 119.2 lb

## 2014-03-09 DIAGNOSIS — F3341 Major depressive disorder, recurrent, in partial remission: Secondary | ICD-10-CM

## 2014-03-09 DIAGNOSIS — F4310 Post-traumatic stress disorder, unspecified: Secondary | ICD-10-CM

## 2014-03-09 DIAGNOSIS — IMO0002 Reserved for concepts with insufficient information to code with codable children: Secondary | ICD-10-CM

## 2014-03-09 DIAGNOSIS — R945 Abnormal results of liver function studies: Secondary | ICD-10-CM

## 2014-03-09 DIAGNOSIS — R7989 Other specified abnormal findings of blood chemistry: Secondary | ICD-10-CM

## 2014-03-09 DIAGNOSIS — G8929 Other chronic pain: Secondary | ICD-10-CM

## 2014-03-09 DIAGNOSIS — Z23 Encounter for immunization: Secondary | ICD-10-CM

## 2014-03-09 DIAGNOSIS — F39 Unspecified mood [affective] disorder: Secondary | ICD-10-CM

## 2014-03-09 DIAGNOSIS — F411 Generalized anxiety disorder: Secondary | ICD-10-CM

## 2014-03-09 MED ORDER — OXYCODONE HCL 10 MG PO TABS: 10.0000 mg | ORAL_TABLET | Freq: Four times a day (QID) | ORAL | Status: DC | PRN

## 2014-03-09 MED ORDER — CLINDAMYCIN PHOSPHATE 1 % EX LOTN: 1.0000 "application " | TOPICAL_LOTION | Freq: Every day | CUTANEOUS | Status: DC | PRN

## 2014-03-09 MED ORDER — ALPRAZOLAM 1 MG PO TABS: 1.0000 mg | ORAL_TABLET | Freq: Three times a day (TID) | ORAL | Status: DC | PRN

## 2014-03-09 MED ORDER — OXYCODONE HCL 30 MG PO TABS: 30.0000 mg | ORAL_TABLET | Freq: Four times a day (QID) | ORAL | Status: DC

## 2014-03-09 MED ORDER — FLUTICASONE-SALMETEROL 250-50 MCG/DOSE IN AEPB: 1.0000 | INHALATION_SPRAY | Freq: Two times a day (BID) | RESPIRATORY_TRACT | Status: AC

## 2014-03-09 NOTE — Progress Notes (Signed)
Subjective:    Patient ID: Morgan Kelly, female    DOB: 01/10/1951, 63 y.o.   MRN: [id number]  HPI Patient Active Problem List   Diagnosis Date Noted  . Chronic pain 09/05/2011    Still lots of trouble tho meds helpful-lots of control of meds by sister Pain is controlled in general though her course narcotics  Not able to maintain physical therapy  Not motivated to do this at home   . GAD (generalized anxiety disorder) 09/05/2011    Aggravated by life predicaments Xanax 1/2 bid/1 before bed and 1 if wakes early  . Mood disorder 09/05/2011     see history   . PTSD (post-traumatic stress disorder) 09/05/2011     this may make her disease process resistant to treatment   . Depression, major, recurrent 09/05/2011    Meds/ still lots of trouble---depr at [facility]   . Seizures---none recent 02/18/2013  . Fracture of distal femur, left ---still pain w/ activity 01/15/2013  . Dislocation of left thumb, IP joint 01/15/2013      . Raynaud's disease and hyperhidrosis resolved by sympathectomy 1990s 04/01/2012  . Cervical spondylosis With facet arthropathy 04/01/2012  . Degenerative disc disease, cervical 04/01/2012  . IBS (irritable bowel syndrome) 03/06/2012  . 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 Nicotine abuse--- past history of  10/30/2011  . Asthma in remission 09/05/2011  . Edema--- none recent  09/05/2011  . Hypothyroid----on meds and no need to retest  09/05/2011  . Acne--- stable  09/05/2011  . Other and unspecified hyperlipidemia 09/05/2011  . Fibromyalgia 09/05/2011  . Eczema 09/05/2011  . Abnormal LFTs--- see history  09/05/2011   Prior to Admission medications   Medication Sig Start Date End Date Taking? Authorizing Provider  acetaminophen (TYLENOL) 500 MG tablet Take 1-2 tablets (500-1,000 mg total) by mouth every 6 (six) hours as needed for pain or fever. 01/18/13  Yes Jari Pigg, PA-C  ALPRAZolam Duanne Moron) 1 MG tablet Take 1 tablet (1 mg  total) by mouth 3 (three) times daily as needed for anxiety. 01/05/14  Yes Leandrew Koyanagi, MD  citalopram (CELEXA) 40 MG tablet TAKE 1 TABLET ONCE DAILY. 01/05/14  Yes Leandrew Koyanagi, MD  clindamycin (CLEOCIN T) 1 % lotion Apply 1 application topically daily as needed. Apply sparingly and massage in. 01/05/14  Yes Leandrew Koyanagi, MD  diphenoxylate-atropine (LOMOTIL) 2.5-0.025 MG per tablet TAKE 1 TABLET FOUR TIMES DAILY AS NEEDED FOR DIARRHEA. 03/04/14  Yes Leandrew Koyanagi, MD  docusate sodium 100 MG CAPS Take 100 mg by mouth 2 (two) times daily. 01/18/13  Yes Jari Pigg, PA-C  doxycycline (VIBRA-TABS) 100 MG tablet Take 1 tablet (100 mg total) by mouth daily. 09/15/13  Yes Leandrew Koyanagi, MD  Fluticasone-Salmeterol (ADVAIR) 250-50 MCG/DOSE AEPB Inhale 1 puff into the lungs every 12 (twelve) hours. 01/05/14  Yes Leandrew Koyanagi, MD  levothyroxine (SYNTHROID, LEVOTHROID) 200 MCG tablet Take 1 tablet (200 mcg total) by mouth daily. 06/30/13  Yes Leandrew Koyanagi, MD  lidocaine (LIDODERM) 5 % Place 3 patches onto the skin daily. Remove & Discard patch within 12 hours or as directed by MD 01/23/13  Yes Leandrew Koyanagi, MD  loratadine (CLARITIN) 10 MG tablet Take 10 mg by mouth daily as needed for allergies.    Yes Historical Provider, MD  Magnesium Hydroxide (MILK OF MAGNESIA PO) Take 5-10 mLs by mouth daily. Constipation   Yes Historical Provider, MD  montelukast (SINGULAIR)  10 MG tablet Take 1 tablet (10 mg total) by mouth at bedtime. 01/05/14  Yes Leandrew Koyanagi, MD  oxycodone (ROXICODONE) 30 MG immediate release tablet Take 1 tablet (30 mg total) by mouth every 6 (six) hours. For 02/16/14 or after 01/05/14  Yes Leandrew Koyanagi, MD  Oxycodone HCl 10 MG TABS Take 1 tablet (10 mg total) by mouth every 6 (six) hours as needed. For 02/16/14 or after 01/05/14  Yes Leandrew Koyanagi, MD  PROAIR HFA 108 [phone] BASE) MCG/ACT inhaler USE 1 TO 2 PUFFS EVERY 4 TO 6 HOURS AS NEEDED. 04/20/13  Yes  Leandrew Koyanagi, MD  spironolactone (ALDACTONE) 100 MG tablet TAKE 1 TABLET ONCE DAILY. USE AS NEEDED   Yes Leandrew Koyanagi, MD  ferrous sulfate 325 (65 FE) MG tablet Take 1 tablet (325 mg total) by mouth 3 (three) times daily after meals. 01/18/13   Jari Pigg, PA-C  oxycodone (ROXICODONE) 30 MG immediate release tablet Take 1 tablet (30 mg total) by mouth every 6 (six) hours. For 01/16/14 or after 01/05/14   Leandrew Koyanagi, MD  Oxycodone HCl 10 MG TABS Take 1 tablet (10 mg total) by mouth every 6 (six) hours as needed. For 01/16/14 or after 01/05/14   Leandrew Koyanagi, MD   Feels more worthless--has no more control/totally dependent on sister and feels she is not forthcoming Denying her a car/transportation even [employer] lidoderm has been very helpful but now no longer covered  Review of Systems  Constitutional: Negative for unexpected weight change.  HENT: Negative.   Musculoskeletal:       Still lots of post injury pain L leg that interferes w/ activity  Neurological: Negative for dizziness, tremors, syncope and numbness.  Psychiatric/Behavioral: Negative for confusion and decreased concentration.       Objective:   Physical Exam  Nursing note and vitals reviewed. Constitutional: She is oriented to person, place, and time. She appears well-developed and well-nourished. No distress.  HENT:  Head: Normocephalic and atraumatic.  Eyes: Pupils are equal, round, and reactive to light.  Neck: Normal range of motion.  Cardiovascular: Normal rate and regular rhythm.   Pulmonary/Chest: Effort normal. No respiratory distress.  Musculoskeletal: Normal range of motion.  Neurological: She is alert and oriented to person, place, and time.  Skin: Skin is warm and dry.  Psychiatric:  Mood still varies greatly depending on the conversation and she will get withdrawn, pessimistic and angry as she discusses her relationship with her sister and her lack of ability to maintain an independent  existence   BP 106/66  Pulse 98  Temp(Src) 98.2 F (36.8 C) (Oral)  Resp 16  Ht 5\' 1"  (1.549 m)  Wt 119 lb 3.2 oz (54.069 kg)  BMI 22.53 kg/m2  SpO2 94%        Assessment & Plan:  Need for prophylactic vaccination and inoculation against influenza - Plan: Pneumococcal conjugate vaccine 13-valent IM  Need for prophylactic vaccination against Streptococcus pneumoniae (pneumococcus) - Plan: Flu Vaccine QUAD 36+ mos IM  Chronic pain  Major depressive disorder, recurrent episode, in partial remission  GAD (generalized anxiety disorder)  Mood disorder  PTSD (post-traumatic stress disorder)  Abnormal LFTs  Meds ordered this encounter  Medications  . ALPRAZolam (XANAX) 1 MG tablet    Sig: Take 1 tablet (1 mg total) by mouth 3 (three) times daily as needed for anxiety.    Dispense:  90 tablet    Refill:  1  . Fluticasone-Salmeterol (  ADVAIR) 250-50 MCG/DOSE AEPB    Sig: Inhale 1 puff into the lungs every 12 (twelve) hours.    Dispense:  60 each    Refill:  5  . Oxycodone HCl 10 MG TABS    Sig: Take 1 tablet (10 mg total) by mouth every 6 (six) hours as needed. For 03/19/14 or after    Dispense:  120 tablet    Refill:  0  . oxycodone (ROXICODONE) 30 MG immediate release tablet    Sig: Take 1 tablet (30 mg total) by mouth every 6 (six) hours. For 03/19/14 or after    Dispense:  120 tablet    Refill:  0  . oxycodone (ROXICODONE) 30 MG immediate release tablet    Sig: Take 1 tablet (30 mg total) by mouth every 6 (six) hours. For 04/18/14 or after    Dispense:  120 tablet    Refill:  0  . Oxycodone HCl 10 MG TABS    Sig: Take 1 tablet (10 mg total) by mouth every 6 (six) hours as needed. For 04/18/14 or after    Dispense:  120 tablet    Refill:  0  . clindamycin (CLEOCIN T) 1 % lotion    Sig: Apply 1 application topically daily as needed. Apply sparingly and massage in.    Dispense:  60 mL    Refill:  5   21mos f-u

## 2014-04-05 ENCOUNTER — Telehealth: Payer: Self-pay

## 2014-04-05 NOTE — Telephone Encounter (Signed)
Pt of Dr. Laney Pastor, states that she was in the hospital and believes that her sister had her sing papers while she was not in the right state of mind, saying that she is mentally incompetent. She wants to make sure that this did not happen. Also, she says that she needs Dr. Laney Pastor to speak with her sister about going the therapy with her, says she needs to "loosen up the stings."  Feels like she is being controlled and that her medications are not being distributed properly

## 2014-04-06 NOTE — Telephone Encounter (Signed)
Patient wants to cancel the message that was put in originally

## 2014-05-04 ENCOUNTER — Ambulatory Visit (INDEPENDENT_AMBULATORY_CARE_PROVIDER_SITE_OTHER): Payer: Medicare PPO | Admitting: Internal Medicine

## 2014-05-04 ENCOUNTER — Encounter: Payer: Self-pay | Admitting: Internal Medicine

## 2014-05-04 VITALS — BP 101/59 | HR 94 | Temp 97.7°F | Resp 16 | Ht 60.75 in | Wt 123.0 lb

## 2014-05-04 DIAGNOSIS — G8929 Other chronic pain: Secondary | ICD-10-CM

## 2014-05-04 DIAGNOSIS — F3341 Major depressive disorder, recurrent, in partial remission: Secondary | ICD-10-CM

## 2014-05-04 DIAGNOSIS — E039 Hypothyroidism, unspecified: Secondary | ICD-10-CM

## 2014-05-04 DIAGNOSIS — L709 Acne, unspecified: Secondary | ICD-10-CM

## 2014-05-04 DIAGNOSIS — F4310 Post-traumatic stress disorder, unspecified: Secondary | ICD-10-CM

## 2014-05-04 DIAGNOSIS — J45998 Other asthma: Secondary | ICD-10-CM

## 2014-05-04 DIAGNOSIS — F39 Unspecified mood [affective] disorder: Secondary | ICD-10-CM

## 2014-05-04 DIAGNOSIS — F411 Generalized anxiety disorder: Secondary | ICD-10-CM

## 2014-05-04 MED ORDER — CLINDAMYCIN PHOSPHATE 1 % EX LOTN: 1.0000 "application " | TOPICAL_LOTION | Freq: Every day | CUTANEOUS | Status: AC | PRN

## 2014-05-04 MED ORDER — DOXYCYCLINE HYCLATE 100 MG PO TABS: 100.0000 mg | ORAL_TABLET | Freq: Every day | ORAL | Status: AC

## 2014-05-04 MED ORDER — LIDOCAINE 5 % EX PTCH: 3.0000 | MEDICATED_PATCH | CUTANEOUS | Status: DC

## 2014-05-04 MED ORDER — OXYCODONE HCL 10 MG PO TABS: 10.0000 mg | ORAL_TABLET | Freq: Four times a day (QID) | ORAL | Status: AC | PRN

## 2014-05-04 MED ORDER — ALPRAZOLAM 1 MG PO TABS: 1.0000 mg | ORAL_TABLET | Freq: Three times a day (TID) | ORAL | Status: AC | PRN

## 2014-05-04 MED ORDER — OXYCODONE HCL 30 MG PO TABS: 30.0000 mg | ORAL_TABLET | Freq: Four times a day (QID) | ORAL | Status: AC

## 2014-05-04 MED ORDER — OXYCODONE HCL 30 MG PO TABS: 30.0000 mg | ORAL_TABLET | Freq: Four times a day (QID) | ORAL | Status: DC

## 2014-05-04 MED ORDER — ALBUTEROL SULFATE HFA 108 (90 BASE) MCG/ACT IN AERS: INHALATION_SPRAY | RESPIRATORY_TRACT | Status: AC

## 2014-05-04 MED ORDER — LEVOTHYROXINE SODIUM 200 MCG PO TABS: 200.0000 ug | ORAL_TABLET | Freq: Every day | ORAL | Status: AC

## 2014-05-04 NOTE — Progress Notes (Signed)
Chronic pain  Still incomplete control but not willing to pursue change to chronic pain tx ctr//affects activity tho pattern very  nonspecifis  And all over  Major depressive disorder, recurrent episode, in partial remission  Often depr at relat w/ sis//lots of lonliness/tears at night--still feels overcontrolled  Pursuing legal advice to allow some independence-ie car! And $$  GAD (generalized anxiety disorder)  Stable w/ prn meds and celexa  Mood disorder  See hx  PTSD (post-traumatic stress disorder)  See hx  Acne, unspecified acne type  Stable w/ tx  Asthma in remission  Inhalers effective  Hypothyroidism, unspecified hypothyroidism type -   Asymptomatic on meds//last labs ok  Exam BP 101/59 mmHg  Pulse 94  Temp(Src) 97.7 F (36.5 C)  Resp 16  Ht 5' 0.75" (1.543 m)  Wt 123 lb (55.792 kg)  BMI 23.43 kg/m2  SpO2 100% Skin clear Walks w/ cane Mood depr w/ tears at times but no SI and more positive than recent visits  Chronic pain  Major depressive disorder, recurrent episode, in partial remission  GAD (generalized anxiety disorder)  Mood disorder  PTSD (post-traumatic stress disorder)  Acne, unspecified acne type  Asthma in remission  Hypothyroidism, unspecified hypothyroidism type - Plan: levothyroxine (SYNTHROID, LEVOTHROID) 200 MCG tablet  Meds ordered this encounter  Medications  . lidocaine (LIDODERM) 5 %    Sig: Place 3 patches onto the skin daily. Remove & Discard patch within 12 hours or as directed by MD    Dispense:  90 patch    Refill:  5    May keep on hold til medicare approves  . clindamycin (CLEOCIN T) 1 % lotion    Sig: Apply 1 application topically daily as needed. Apply sparingly and massage in.    Dispense:  60 mL    Refill:  5  . albuterol (PROAIR HFA) 108 (90 BASE) MCG/ACT inhaler    Sig: USE 1 TO 2 PUFFS EVERY 4 TO 6 HOURS AS NEEDED.    Dispense:  8.5 g    Refill:  5  . doxycycline (VIBRA-TABS) 100 MG tablet    Sig: Take 1  tablet (100 mg total) by mouth daily.    Dispense:  30 tablet    Refill:  5  . levothyroxine (SYNTHROID, LEVOTHROID) 200 MCG tablet    Sig: Take 1 tablet (200 mcg total) by mouth daily.    Dispense:  90 tablet    Refill:  3  . ALPRAZolam (XANAX) 1 MG tablet    Sig: Take 1 tablet (1 mg total) by mouth 3 (three) times daily as needed for anxiety.    Dispense:  90 tablet    Refill:  1  . oxycodone (ROXICODONE) 30 MG immediate release tablet    Sig: Take 1 tablet (30 mg total) by mouth every 6 (six) hours. For 05/18/14 or after    Dispense:  120 tablet    Refill:  0  . Oxycodone HCl 10 MG TABS    Sig: Take 1 tablet (10 mg total) by mouth every 6 (six) hours as needed. For 05/18/14 or after    Dispense:  120 tablet    Refill:  0  . Oxycodone HCl 10 MG TABS    Sig: Take 1 tablet (10 mg total) by mouth every 6 (six) hours as needed. For 06/16/14 or after    Dispense:  120 tablet    Refill:  0  . oxycodone (ROXICODONE) 30 MG immediate release tablet    Sig:  Take 1 tablet (30 mg total) by mouth every 6 (six) hours. For 06/16/14 or after    Dispense:  120 tablet    Refill:  0   F/u 31mos  Has been able to go 2 mos with managing appropriately with sister's control of pain meds and xanax

## 2014-05-09 ENCOUNTER — Telehealth: Payer: Self-pay

## 2014-05-09 DIAGNOSIS — M5430 Sciatica, unspecified side: Secondary | ICD-10-CM | POA: Insufficient documentation

## 2014-05-09 NOTE — Telephone Encounter (Signed)
PA needed for lidocaine patches. Completed on covermymeds. Pending.

## 2014-05-10 NOTE — Telephone Encounter (Signed)
PA denied d/t not being covered for pt's Dxs. Dr Laney Pastor, do you want to Rx anything else?

## 2014-05-12 MED ORDER — LIDOCAINE 5 % EX OINT: 1.0000 "application " | TOPICAL_OINTMENT | Freq: Three times a day (TID) | CUTANEOUS | Status: AC | PRN

## 2014-05-12 NOTE — Addendum Note (Signed)
Addended by: Leandrew Koyanagi on: 05/12/2014 01:51 PM   Modules accepted: Orders

## 2014-05-12 NOTE — Telephone Encounter (Signed)
Meds ordered this encounter  Medications  . lidocaine (XYLOCAINE) 5 % ointment    Sig: Apply 1 application topically 3 (three) times daily as needed.    Dispense:  50 g    Refill:  1    To see if insurance will cover   Instead of lidoderm

## 2014-07-05 ENCOUNTER — Encounter (HOSPITAL_COMMUNITY): Payer: Self-pay | Admitting: Emergency Medicine

## 2014-07-05 ENCOUNTER — Emergency Department (HOSPITAL_COMMUNITY): Payer: Medicare HMO

## 2014-07-05 ENCOUNTER — Inpatient Hospital Stay (HOSPITAL_COMMUNITY)
Admission: EM | Admit: 2014-07-05 | Discharge: 2014-07-25 | DRG: 871 | Disposition: E | Payer: Medicare HMO | Attending: Internal Medicine | Admitting: Internal Medicine

## 2014-07-05 DIAGNOSIS — L03115 Cellulitis of right lower limb: Secondary | ICD-10-CM | POA: Diagnosis present

## 2014-07-05 DIAGNOSIS — N179 Acute kidney failure, unspecified: Secondary | ICD-10-CM | POA: Diagnosis present

## 2014-07-05 DIAGNOSIS — M79605 Pain in left leg: Secondary | ICD-10-CM

## 2014-07-05 DIAGNOSIS — G9341 Metabolic encephalopathy: Secondary | ICD-10-CM | POA: Diagnosis not present

## 2014-07-05 DIAGNOSIS — A419 Sepsis, unspecified organism: Secondary | ICD-10-CM | POA: Diagnosis present

## 2014-07-05 DIAGNOSIS — R739 Hyperglycemia, unspecified: Secondary | ICD-10-CM | POA: Diagnosis present

## 2014-07-05 DIAGNOSIS — L97529 Non-pressure chronic ulcer of other part of left foot with unspecified severity: Secondary | ICD-10-CM

## 2014-07-05 DIAGNOSIS — R6520 Severe sepsis without septic shock: Secondary | ICD-10-CM | POA: Diagnosis present

## 2014-07-05 DIAGNOSIS — E875 Hyperkalemia: Secondary | ICD-10-CM | POA: Diagnosis present

## 2014-07-05 DIAGNOSIS — T380X5A Adverse effect of glucocorticoids and synthetic analogues, initial encounter: Secondary | ICD-10-CM | POA: Diagnosis present

## 2014-07-05 DIAGNOSIS — F411 Generalized anxiety disorder: Secondary | ICD-10-CM | POA: Diagnosis present

## 2014-07-05 DIAGNOSIS — N19 Unspecified kidney failure: Secondary | ICD-10-CM | POA: Diagnosis present

## 2014-07-05 DIAGNOSIS — Z86711 Personal history of pulmonary embolism: Secondary | ICD-10-CM | POA: Diagnosis not present

## 2014-07-05 DIAGNOSIS — E785 Hyperlipidemia, unspecified: Secondary | ICD-10-CM | POA: Diagnosis present

## 2014-07-05 DIAGNOSIS — E871 Hypo-osmolality and hyponatremia: Secondary | ICD-10-CM | POA: Diagnosis present

## 2014-07-05 DIAGNOSIS — R740 Nonspecific elevation of levels of transaminase and lactic acid dehydrogenase [LDH]: Secondary | ICD-10-CM | POA: Diagnosis present

## 2014-07-05 DIAGNOSIS — E43 Unspecified severe protein-calorie malnutrition: Secondary | ICD-10-CM | POA: Diagnosis present

## 2014-07-05 DIAGNOSIS — R4182 Altered mental status, unspecified: Secondary | ICD-10-CM

## 2014-07-05 DIAGNOSIS — K7200 Acute and subacute hepatic failure without coma: Secondary | ICD-10-CM | POA: Diagnosis present

## 2014-07-05 DIAGNOSIS — L03116 Cellulitis of left lower limb: Secondary | ICD-10-CM | POA: Diagnosis present

## 2014-07-05 DIAGNOSIS — Z79899 Other long term (current) drug therapy: Secondary | ICD-10-CM

## 2014-07-05 DIAGNOSIS — G894 Chronic pain syndrome: Secondary | ICD-10-CM | POA: Diagnosis present

## 2014-07-05 DIAGNOSIS — F17210 Nicotine dependence, cigarettes, uncomplicated: Secondary | ICD-10-CM | POA: Diagnosis present

## 2014-07-05 DIAGNOSIS — E872 Acidosis: Secondary | ICD-10-CM | POA: Diagnosis present

## 2014-07-05 DIAGNOSIS — R609 Edema, unspecified: Secondary | ICD-10-CM

## 2014-07-05 DIAGNOSIS — R451 Restlessness and agitation: Secondary | ICD-10-CM | POA: Diagnosis present

## 2014-07-05 DIAGNOSIS — Z6823 Body mass index (BMI) 23.0-23.9, adult: Secondary | ICD-10-CM | POA: Diagnosis not present

## 2014-07-05 DIAGNOSIS — Z66 Do not resuscitate: Secondary | ICD-10-CM | POA: Diagnosis present

## 2014-07-05 DIAGNOSIS — Z9119 Patient's noncompliance with other medical treatment and regimen: Secondary | ICD-10-CM | POA: Diagnosis present

## 2014-07-05 DIAGNOSIS — I872 Venous insufficiency (chronic) (peripheral): Secondary | ICD-10-CM | POA: Diagnosis present

## 2014-07-05 DIAGNOSIS — J9601 Acute respiratory failure with hypoxia: Secondary | ICD-10-CM | POA: Diagnosis not present

## 2014-07-05 DIAGNOSIS — L97421 Non-pressure chronic ulcer of left heel and midfoot limited to breakdown of skin: Secondary | ICD-10-CM | POA: Diagnosis present

## 2014-07-05 DIAGNOSIS — I959 Hypotension, unspecified: Secondary | ICD-10-CM

## 2014-07-05 DIAGNOSIS — M797 Fibromyalgia: Secondary | ICD-10-CM | POA: Diagnosis present

## 2014-07-05 DIAGNOSIS — L0390 Cellulitis, unspecified: Secondary | ICD-10-CM

## 2014-07-05 DIAGNOSIS — M79606 Pain in leg, unspecified: Secondary | ICD-10-CM | POA: Diagnosis present

## 2014-07-05 DIAGNOSIS — Z515 Encounter for palliative care: Secondary | ICD-10-CM | POA: Diagnosis not present

## 2014-07-05 DIAGNOSIS — E039 Hypothyroidism, unspecified: Secondary | ICD-10-CM | POA: Diagnosis present

## 2014-07-05 LAB — URINALYSIS, ROUTINE W REFLEX MICROSCOPIC
Glucose, UA: NEGATIVE mg/dL
Hgb urine dipstick: NEGATIVE
Ketones, ur: NEGATIVE mg/dL
Leukocytes, UA: NEGATIVE
NITRITE: NEGATIVE
PH: 5 (ref 5.0–8.0)
Protein, ur: NEGATIVE mg/dL
Specific Gravity, Urine: 1.019 (ref 1.005–1.030)
Urobilinogen, UA: 1 mg/dL (ref 0.0–1.0)

## 2014-07-05 LAB — COMPREHENSIVE METABOLIC PANEL
ALBUMIN: 2.2 g/dL — AB (ref 3.5–5.2)
ALT: 29 U/L (ref 0–35)
AST: 86 U/L — AB (ref 0–37)
Alkaline Phosphatase: 251 U/L — ABNORMAL HIGH (ref 39–117)
Anion gap: 13 (ref 5–15)
BILIRUBIN TOTAL: 4.1 mg/dL — AB (ref 0.3–1.2)
BUN: 32 mg/dL — ABNORMAL HIGH (ref 6–23)
CO2: 19 mmol/L (ref 19–32)
CREATININE: 3.01 mg/dL — AB (ref 0.50–1.10)
Calcium: 8.2 mg/dL — ABNORMAL LOW (ref 8.4–10.5)
Chloride: 96 mEq/L (ref 96–112)
GFR calc Af Amer: 18 mL/min — ABNORMAL LOW (ref >90)
GFR, EST NON AFRICAN AMERICAN: 16 mL/min — AB (ref >90)
Glucose, Bld: 74 mg/dL (ref 70–99)
Potassium: 5 mmol/L (ref 3.5–5.1)
SODIUM: 128 mmol/L — AB (ref 135–145)
Total Protein: 6 g/dL (ref 6.0–8.3)

## 2014-07-05 LAB — CBC WITH DIFFERENTIAL/PLATELET
Basophils Absolute: 0 10*3/uL (ref 0.0–0.1)
Basophils Relative: 0 % (ref 0–1)
EOS ABS: 0 10*3/uL (ref 0.0–0.7)
Eosinophils Relative: 0 % (ref 0–5)
HEMATOCRIT: 35.9 % — AB (ref 36.0–46.0)
HEMOGLOBIN: 12.8 g/dL (ref 12.0–15.0)
LYMPHS ABS: 0.6 10*3/uL — AB (ref 0.7–4.0)
Lymphocytes Relative: 6 % — ABNORMAL LOW (ref 12–46)
MCH: 38 pg — ABNORMAL HIGH (ref 26.0–34.0)
MCHC: 35.7 g/dL (ref 30.0–36.0)
MCV: 106.5 fL — ABNORMAL HIGH (ref 78.0–100.0)
Monocytes Absolute: 0.4 10*3/uL (ref 0.1–1.0)
Monocytes Relative: 4 % (ref 3–12)
NEUTROS ABS: 9 10*3/uL — AB (ref 1.7–7.7)
NEUTROS PCT: 90 % — AB (ref 43–77)
PLATELETS: 140 10*3/uL — AB (ref 150–400)
RBC: 3.37 MIL/uL — AB (ref 3.87–5.11)
RDW: 13.8 % (ref 11.5–15.5)
WBC: 10.1 10*3/uL (ref 4.0–10.5)

## 2014-07-05 LAB — APTT: aPTT: 41 seconds — ABNORMAL HIGH (ref 24–37)

## 2014-07-05 LAB — BRAIN NATRIURETIC PEPTIDE: B Natriuretic Peptide: 93.7 pg/mL (ref 0.0–100.0)

## 2014-07-05 LAB — PROTIME-INR
INR: 1.43 (ref 0.00–1.49)
Prothrombin Time: 17.6 seconds — ABNORMAL HIGH (ref 11.6–15.2)

## 2014-07-05 LAB — TROPONIN I: Troponin I: <0.03 ng/mL (ref <0.031)

## 2014-07-05 MED ORDER — HEPARIN (PORCINE) IN NACL 100-0.45 UNIT/ML-% IJ SOLN
900.0000 [IU]/h | INTRAMUSCULAR | Status: DC
  Administered 2014-07-05: 900 [IU]/h via INTRAVENOUS
  Filled 2014-07-05: qty 250

## 2014-07-05 MED ORDER — HEPARIN BOLUS VIA INFUSION
2000.0000 [IU] | Freq: Once | INTRAVENOUS | Status: AC
  Administered 2014-07-05: 2000 [IU] via INTRAVENOUS
  Filled 2014-07-05: qty 2000

## 2014-07-05 MED ORDER — HYDROMORPHONE HCL 1 MG/ML IJ SOLN
1.0000 mg | Freq: Once | INTRAMUSCULAR | Status: AC
  Administered 2014-07-05: 1 mg via INTRAVENOUS
  Filled 2014-07-05: qty 1

## 2014-07-05 NOTE — ED Notes (Signed)
MD at bedside.  hospitalist 
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
## 2014-07-05 NOTE — ED Notes (Signed)
Heparin gtt stopped per Hijazi MD

## 2014-07-05 NOTE — ED Notes (Signed)
MD Aline Brochure at bedside to assess and utilize doppler to assess LLE and RLE pulses. Left foot is cold to touch and left leg above ankle is hot to touch and reddened from ankle to left upper thigh.
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
## 2014-07-05 NOTE — ED Notes (Signed)
Bed: AY04 Expected date: 07/04/2014 Expected time: 8:21 PM Means of arrival: Ambulance Comments: Left leg pain

## 2014-07-05 NOTE — ED Notes (Signed)
Family at bedside. 

## 2014-07-05 NOTE — ED Notes (Signed)
Pt's contact:  Barbette Or (sister) --- tel# [phone].

## 2014-07-06 ENCOUNTER — Ambulatory Visit: Payer: Medicare PPO | Admitting: Internal Medicine

## 2014-07-06 ENCOUNTER — Inpatient Hospital Stay (HOSPITAL_COMMUNITY): Payer: Medicare HMO

## 2014-07-06 DIAGNOSIS — R609 Edema, unspecified: Secondary | ICD-10-CM

## 2014-07-06 DIAGNOSIS — M79605 Pain in left leg: Secondary | ICD-10-CM

## 2014-07-06 LAB — HEPATIC FUNCTION PANEL
ALT: 22 U/L (ref 0–35)
AST: 71 U/L — ABNORMAL HIGH (ref 0–37)
Albumin: 1.7 g/dL — ABNORMAL LOW (ref 3.5–5.2)
Alkaline Phosphatase: 187 U/L — ABNORMAL HIGH (ref 39–117)
BILIRUBIN TOTAL: 4.7 mg/dL — AB (ref 0.3–1.2)
Bilirubin, Direct: 1.7 mg/dL — ABNORMAL HIGH (ref 0.0–0.3)
Indirect Bilirubin: 3 mg/dL — ABNORMAL HIGH (ref 0.3–0.9)
Total Protein: 4.8 g/dL — ABNORMAL LOW (ref 6.0–8.3)

## 2014-07-06 LAB — BASIC METABOLIC PANEL
Anion gap: 12 (ref 5–15)
Anion gap: 11 (ref 5–15)
BUN: 38 mg/dL — ABNORMAL HIGH (ref 6–23)
BUN: 31 mg/dL — ABNORMAL HIGH (ref 6–23)
CALCIUM: 7.1 mg/dL — AB (ref 8.4–10.5)
CHLORIDE: 96 meq/L (ref 96–112)
CHLORIDE: 93 meq/L — AB (ref 96–112)
CO2: 20 mmol/L (ref 19–32)
CO2: 18 mmol/L — ABNORMAL LOW (ref 19–32)
Calcium: 7.7 mg/dL — ABNORMAL LOW (ref 8.4–10.5)
Creatinine, Ser: 3.22 mg/dL — ABNORMAL HIGH (ref 0.50–1.10)
Creatinine, Ser: 2.87 mg/dL — ABNORMAL HIGH (ref 0.50–1.10)
GFR calc Af Amer: 17 mL/min — ABNORMAL LOW (ref >90)
GFR calc Af Amer: 19 mL/min — ABNORMAL LOW (ref >90)
GFR calc non Af Amer: 14 mL/min — ABNORMAL LOW (ref >90)
GFR calc non Af Amer: 17 mL/min — ABNORMAL LOW (ref >90)
Glucose, Bld: 72 mg/dL (ref 70–99)
Glucose, Bld: 355 mg/dL — ABNORMAL HIGH (ref 70–99)
POTASSIUM: 5.5 mmol/L — AB (ref 3.5–5.1)
Potassium: 5 mmol/L (ref 3.5–5.1)
SODIUM: 128 mmol/L — AB (ref 135–145)
Sodium: 122 mmol/L — ABNORMAL LOW (ref 135–145)

## 2014-07-06 LAB — CBC
HEMATOCRIT: 32.1 % — AB (ref 36.0–46.0)
HEMOGLOBIN: 11.6 g/dL — AB (ref 12.0–15.0)
MCH: 37.9 pg — AB (ref 26.0–34.0)
MCHC: 36.1 g/dL — ABNORMAL HIGH (ref 30.0–36.0)
MCV: 104.9 fL — ABNORMAL HIGH (ref 78.0–100.0)
Platelets: 155 10*3/uL (ref 150–400)
RBC: 3.06 MIL/uL — AB (ref 3.87–5.11)
RDW: 13.7 % (ref 11.5–15.5)
WBC: 12.2 10*3/uL — ABNORMAL HIGH (ref 4.0–10.5)

## 2014-07-06 LAB — PROCALCITONIN: Procalcitonin: 10.65 ng/mL

## 2014-07-06 LAB — T4, FREE: Free T4: 0.52 ng/dL — ABNORMAL LOW (ref 0.80–1.80)

## 2014-07-06 LAB — CORTISOL: Cortisol, Plasma: >150 ug/dL

## 2014-07-06 LAB — TSH: TSH: >90 u[IU]/mL — ABNORMAL HIGH (ref 0.350–4.500)

## 2014-07-06 LAB — SEDIMENTATION RATE: Sed Rate: 50 mm/hr — ABNORMAL HIGH (ref 0–22)

## 2014-07-06 LAB — LACTIC ACID, PLASMA: LACTIC ACID, VENOUS: 3.3 mmol/L — AB (ref 0.5–2.2)

## 2014-07-06 MED ORDER — PIPERACILLIN-TAZOBACTAM 3.375 G IVPB
INTRAVENOUS | Status: AC
  Filled 2014-07-06: qty 50

## 2014-07-06 MED ORDER — OXYCODONE HCL 5 MG PO TABS
5.0000 mg | ORAL_TABLET | Freq: Four times a day (QID) | ORAL | Status: DC | PRN
  Administered 2014-07-06: 5 mg via ORAL
  Filled 2014-07-06: qty 1

## 2014-07-06 MED ORDER — PIPERACILLIN-TAZOBACTAM IN DEX 2-0.25 GM/50ML IV SOLN
2.2500 g | Freq: Four times a day (QID) | INTRAVENOUS | Status: DC
  Administered 2014-07-06 – 2014-07-07 (×5): 2.25 g via INTRAVENOUS
  Filled 2014-07-06 (×6): qty 50

## 2014-07-06 MED ORDER — SODIUM CHLORIDE 0.9 % IV BOLUS (SEPSIS)
750.0000 mL | Freq: Once | INTRAVENOUS | Status: AC
  Administered 2014-07-06: 750 mL via INTRAVENOUS

## 2014-07-06 MED ORDER — HYDROMORPHONE HCL 1 MG/ML IJ SOLN
INTRAMUSCULAR | Status: AC
  Filled 2014-07-06: qty 1

## 2014-07-06 MED ORDER — PHENYLEPHRINE HCL 10 MG/ML IJ SOLN
30.0000 ug/min | INTRAVENOUS | Status: DC
  Administered 2014-07-06: 45 ug/min via INTRAVENOUS
  Administered 2014-07-06: 165 ug/min via INTRAVENOUS
  Administered 2014-07-06: 60 ug/min via INTRAVENOUS
  Administered 2014-07-06: 30 ug/min via INTRAVENOUS
  Administered 2014-07-06: 85 ug/min via INTRAVENOUS
  Administered 2014-07-06: 170 ug/min via INTRAVENOUS
  Administered 2014-07-07: 175 ug/min via INTRAVENOUS
  Administered 2014-07-07: 100 ug/min via INTRAVENOUS
  Administered 2014-07-07: 180 ug/min via INTRAVENOUS
  Administered 2014-07-07: 165 ug/min via INTRAVENOUS
  Administered 2014-07-07: 175 ug/min via INTRAVENOUS
  Administered 2014-07-07: 180 ug/min via INTRAVENOUS
  Administered 2014-07-07: 135 ug/min via INTRAVENOUS
  Administered 2014-07-07: 175 ug/min via INTRAVENOUS
  Administered 2014-07-07: 70 ug/min via INTRAVENOUS
  Administered 2014-07-07: 115 ug/min via INTRAVENOUS
  Administered 2014-07-07: 150 ug/min via INTRAVENOUS
  Filled 2014-07-06 (×16): qty 1

## 2014-07-06 MED ORDER — SODIUM CHLORIDE 0.9 % IJ SOLN: 3.0000 mL | Freq: Two times a day (BID) | INTRAMUSCULAR | Status: DC

## 2014-07-06 MED ORDER — SODIUM CHLORIDE 0.9 % IV BOLUS (SEPSIS)
500.0000 mL | Freq: Once | INTRAVENOUS | Status: AC
  Administered 2014-07-06: 500 mL via INTRAVENOUS

## 2014-07-06 MED ORDER — VANCOMYCIN HCL IN DEXTROSE 750-5 MG/150ML-% IV SOLN: 750.0000 mg | INTRAVENOUS | Status: DC

## 2014-07-06 MED ORDER — LEVOTHYROXINE SODIUM 100 MCG IV SOLR
100.0000 ug | Freq: Every day | INTRAVENOUS | Status: DC
  Administered 2014-07-06 – 2014-07-07 (×2): 100 ug via INTRAVENOUS
  Filled 2014-07-06 (×2): qty 5

## 2014-07-06 MED ORDER — LORATADINE 10 MG PO TABS: 10.0000 mg | ORAL_TABLET | Freq: Every day | ORAL | Status: DC | PRN

## 2014-07-06 MED ORDER — NOREPINEPHRINE BITARTRATE 1 MG/ML IV SOLN
2.0000 ug/min | INTRAVENOUS | Status: DC
  Administered 2014-07-06: 4 ug/min via INTRAVENOUS
  Administered 2014-07-06: 2 ug/min via INTRAVENOUS
  Administered 2014-07-06: 6 ug/min via INTRAVENOUS
  Filled 2014-07-06: qty 4

## 2014-07-06 MED ORDER — VANCOMYCIN HCL IN DEXTROSE 1-5 GM/200ML-% IV SOLN
INTRAVENOUS | Status: AC
  Filled 2014-07-06: qty 200

## 2014-07-06 MED ORDER — IPRATROPIUM-ALBUTEROL 0.5-2.5 (3) MG/3ML IN SOLN
3.0000 mL | RESPIRATORY_TRACT | Status: DC | PRN
  Administered 2014-07-07: 3 mL via RESPIRATORY_TRACT
  Filled 2014-07-06: qty 3

## 2014-07-06 MED ORDER — HYDROMORPHONE HCL 1 MG/ML IJ SOLN
INTRAMUSCULAR | Status: AC
  Administered 2014-07-06: 1 mg
  Filled 2014-07-06: qty 1

## 2014-07-06 MED ORDER — SODIUM CHLORIDE 0.9 % IV SOLN
INTRAVENOUS | Status: DC
  Administered 2014-07-06 (×2): via INTRAVENOUS

## 2014-07-06 MED ORDER — MORPHINE SULFATE 2 MG/ML IJ SOLN
1.0000 mg | INTRAMUSCULAR | Status: DC | PRN
  Administered 2014-07-06 – 2014-07-07 (×6): 2 mg via INTRAVENOUS
  Filled 2014-07-06 (×6): qty 1

## 2014-07-06 MED ORDER — HEPARIN SODIUM (PORCINE) 5000 UNIT/ML IJ SOLN
5000.0000 [IU] | Freq: Three times a day (TID) | INTRAMUSCULAR | Status: DC
  Administered 2014-07-06 – 2014-07-07 (×5): 5000 [IU] via SUBCUTANEOUS
  Filled 2014-07-06 (×5): qty 1

## 2014-07-06 MED ORDER — ONDANSETRON HCL 4 MG PO TABS: 4.0000 mg | ORAL_TABLET | Freq: Four times a day (QID) | ORAL | Status: DC | PRN

## 2014-07-06 MED ORDER — VANCOMYCIN HCL IN DEXTROSE 750-5 MG/150ML-% IV SOLN
750.0000 mg | INTRAVENOUS | Status: DC
  Administered 2014-07-06: 750 mg via INTRAVENOUS
  Filled 2014-07-06: qty 150

## 2014-07-06 MED ORDER — HYDROCORTISONE NA SUCCINATE PF 100 MG IJ SOLR
50.0000 mg | Freq: Four times a day (QID) | INTRAMUSCULAR | Status: DC
  Administered 2014-07-06 – 2014-07-07 (×4): 50 mg via INTRAVENOUS
  Filled 2014-07-06 (×4): qty 2

## 2014-07-06 MED ORDER — SODIUM CHLORIDE 0.9 % IV BOLUS (SEPSIS)
1800.0000 mL | Freq: Once | INTRAVENOUS | Status: AC
  Administered 2014-07-06: 1000 mL via INTRAVENOUS

## 2014-07-06 MED ORDER — CETYLPYRIDINIUM CHLORIDE 0.05 % MT LIQD
7.0000 mL | Freq: Two times a day (BID) | OROMUCOSAL | Status: DC
  Administered 2014-07-06 (×2): 7 mL via OROMUCOSAL

## 2014-07-06 MED ORDER — HYDROCORTISONE NA SUCCINATE PF 100 MG IJ SOLR
100.0000 mg | Freq: Three times a day (TID) | INTRAMUSCULAR | Status: DC
  Administered 2014-07-06: 100 mg via INTRAVENOUS
  Filled 2014-07-06: qty 2

## 2014-07-06 MED ORDER — IPRATROPIUM-ALBUTEROL 0.5-2.5 (3) MG/3ML IN SOLN: 3.0000 mL | RESPIRATORY_TRACT | Status: DC

## 2014-07-06 MED ORDER — ONDANSETRON HCL 4 MG/2ML IJ SOLN: 4.0000 mg | Freq: Four times a day (QID) | INTRAMUSCULAR | Status: DC | PRN

## 2014-07-06 NOTE — Progress Notes (Signed)
Bilateral lower extremity venous duplex completed:  No evidence of DVT, superficial thrombosis, or Baker's cyst.   

## 2014-07-06 NOTE — Progress Notes (Addendum)
Patient ID: Morgan Kelly, female   DOB: 10/25/1950, 64 y.o.   MRN: [id number] [facility] PROGRESS NOTE  Morgan Kelly VZD:[id number] DOB: [birth year] DOA: 07/17/2014 PCP: Leandrew Koyanagi, MD  Brief narrative: 64 y.o. Female with chronic pain syndrome, fibromyalgia and hypothyroidism presented with several weeks duration of progressively worsening LE pain and swelling, erythema, drainage from several areas in LE's, TTP. 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 Sister also explained that pt has been very confused at home with poor oral intake. Sister confirms DNR status and explains that pt does not want any aggressive interventions. TRH asked to admit to ICU, was placed on Levophed for hypotension.   Assessment and Plan:    Active Problems:   Sepsis secondary to bilateral LE cellulitis - still with significant erythema LLE > RLL, no signs of osteo on left foot xray  - BP still soft and unable to weak off of Levophed - since pt has peripheral  IV access only will change to neo for now and monitor  - continue Vancomycin and Zosyn day #2 - follow up on blood culture, check lactic acid, procalcitonin level per ICU protocol - also check cortisol level and place on stress dose steroids    Acute renal failure  - secondary to pre renal etiology, Cr continues trending up - vanc per pharmacy to be renal doses - continue IVF and repeat BMP in AM   Overt Hypothyroidism, ? Myxedema  - TSH > 90, pt on synthroid 200 mcg at home and reports compliance (sister at bedside says pt not very compliant) - continue IVF Synthroid as it was started in ED - stress dose steroids, IVF    Hyponatremia - likely pre renal secondary to sepsis - IVF as noted above and repeat  BMP in AM   Hyperkalemia - secondary to ARF ? - repeat BMP this afternoon    Transaminitis - from sepsis, shock liver   Severe PCM  - advance diet as pt able to tolerate  DVT prophylaxis  Heparin SQ while pt is in hospital  Code Status: DNR status confirmed with sister (says pt very clear she wants no aggressive interventions other than current regimen) Family Communication: Sister at bedside Disposition Plan: Keep in ICU  IV access:   Peripheral IV Procedures and diagnostic studies;  CXR 07/17/2014  Lungs hypoexpanded, with minimal left basilar atelectasis.    Medical consultants:   None  Other consultants:   None  Anti-infectives:   Vancomycin 1/12 --> Zosyn 1/12 -->  Faye Ramsay, MD  [facility] Pager [phone]  If 7PM-7AM, please contact night-coverage [email or web address] Password [email or web address] 07/06/2014, 8:16 AM   LOS: 1 day   HPI/Subjective: No events overnight.   Objective: Filed Vitals:   07/06/14 0615 07/06/14 0630 07/06/14 0654 07/06/14 0700  BP: 80/45 73/33 86/37  97/50  Pulse: 93 93 96 101  Temp:      TempSrc:      Resp: 19 19 15 19   Height:      Weight:      SpO2: 100% 98% 98% 98%    Intake/Output Summary (Last 24 hours) at 07/06/14 0816 Last data filed at 07/06/14 0645  Gross per 24 hour  Intake    575  ml  Output      0 ml  Net    575 ml    Exam:   General:  Pt is somnolent but easy to arouse, NAD, frail and ill appearing   Cardiovascular: Regular rate and rhythm, S1/S2, no murmurs, no rubs, no gallops  Respiratory: Clear to auscultation bilaterally, no wheezing, diminished breath sounds at bases   Abdomen: Soft, non tender, non distended, bowel sounds present, no guarding  Extremities: Bilateral LE edema, erythema with one blister ruptured on the left foot with mild purulent drainage  Data Reviewed: Basic Metabolic Panel:  Recent Labs Lab 07/04/2014 2118 07/06/14 0640  NA 128* 128*  K 5.0 5.5*  CL 96 96  CO2 19 20  GLUCOSE 74 72  BUN  32* 38*  CREATININE 3.01* 3.22*  CALCIUM 8.2* 7.7*   Liver Function Tests:  Recent Labs Lab 07/09/2014 2118  AST 86*  ALT 29  ALKPHOS 251*  BILITOT 4.1*  PROT 6.0  ALBUMIN 2.2*   CBC:  Recent Labs Lab 07/01/2014 2118 07/06/14 0640  WBC 10.1 12.2*  NEUTROABS 9.0*  --   HGB 12.8 11.6*  HCT 35.9* 32.1*  MCV 106.5* 104.9*  PLT 140* 155   Cardiac Enzymes:  Recent Labs Lab 07/01/2014 2118  TROPONINI <0.03   Scheduled Meds: . heparin  5,000 Units Subcutaneous 3 times per day  . levothyroxine  100 mcg Intravenous Daily  . ZOSYN  IV  2.25 g Intravenous 4 times per day  . vancomycin  750 mg Intravenous Q48H   Continuous Infusions: . sodium chloride 100 mL/hr at 07/06/14 0600  . norepinephrine (LEVOPHED) Adult infusion 4 mcg/min (07/06/14 0747)
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
## 2014-07-06 NOTE — Consult Note (Signed)
WOC wound consult note Reason for Consult:Bilateral LE erythema, edema and open wounds, L>R. Long nails in need of debridement. Wound type: infectious, venous insufficiency, perhaps mixed etiology Pressure Ulcer POA: No Measurement: Two wounds on left dorsal foot:  Proximal measures 1.2cm x 2cm and is a yellow fluid- filled blister. The distal area measures 0.5cm x 3cm x 0.2cm with a moist, red wound bed. Wound bed: yellow, fluctuant, fluid-filled blisters Drainage (amount, consistency, odor) scant serous drainage from dorsal left foot wounds Periwound: intact, erythematous, edematous (L>R) Dressing procedure/placement/frequency: I have implemented orders for care of the bilateral LEs, albeit the left is much worse than the right and the right needs seem to be primarily hygenic.  The left LE (odrsum of foot with two wounds) will have a single layer of xeroform gauze applied twice daily after cleansing.  Both LEs will have pressure redistribution via Prevalon Boots. Unfortunately, there is no option for inpatient toenail debridement.  Please refer to outpatient podiatry services if you agree this is an unmet need. Systemic antibiotic therapy is underway. [place] nursing team will not follow, but will remain available to this patient, the nursing and medical teams  Please re-consult if needed. Thanks, Maudie Flakes, MSN, RN, [place], [place], [place] [phone])

## 2014-07-06 NOTE — Progress Notes (Signed)
eLink Physician-Brief Progress Note Patient Name: Morgan Kelly DOB: 12-05-1950 MRN: [id number]   Date of Service  07/06/2014  HPI/Events of Note  D/w primary. sepsis  eICU Interventions  Pt does not want intervention however I suggested CT extremtity Have giveb 30 cc/kg bolus, lactic pending, stress steroids adjusted Refusing line To neo per Dr Chase Caller suggestion wihtout line     Intervention Category Major Interventions: Hypotension - evaluation and management  FEINSTEIN,DANIEL J. 07/06/2014, 3:52 PM

## 2014-07-06 NOTE — Progress Notes (Signed)
ANTIBIOTIC CONSULT NOTE - INITIAL  Pharmacy Consult for Vancomycin and Zosyn  Indication: Sepsis syndrome with left shift, renal failure, hypotension and cellulitis of lower extremities  Allergies  Allergen Reactions  . Elavil [Amitriptyline Hcl]     Hangover-can take now  . Eszopiclone Other (See Comments)    Unknown   . Flexeril [Cyclobenzaprine Hcl]   . Neurontin [Gabapentin] Other (See Comments)    hallucinations  . Other Other (See Comments)    Steroids by mouth or injections - psychosis  . Pregabalin     Blurred vision  . Soma [Carisoprodol] Nausea And Vomiting  . Ultracet [Tramadol-Acetaminophen] Nausea Only    Vision changes  . Ultram [Tramadol Hcl]     Visual changes  . Wellbutrin [Bupropion Hcl]     anxious  . Zolpidem Tartrate     Patient Measurements: Height: 5\' 3"  (160 cm) Weight: 130 lb 8.2 oz (59.2 kg) IBW/kg (Calculated) : 52.4 Adjusted Body Weight:   Vital Signs: Temp: 97.5 F (36.4 C) (01/13 0520) Temp Source: Oral (01/13 0520) BP: 80/45 mmHg (01/13 0615) Pulse Rate: 93 (01/13 0615) Intake/Output from previous day: 01/12 0701 - 01/13 0700 In: 75 [I.V.:75] Out: -  Intake/Output from this shift: Total I/O In: 75 [I.V.:75] Out: -   Labs:  Recent Labs  07/16/2014 2118  WBC 10.1  HGB 12.8  PLT 140*  CREATININE 3.01*   Estimated Creatinine Clearance: 16 mL/min (by C-G formula based on Cr of 3.01). No results for input(s): VANCOTROUGH, VANCOPEAK, VANCORANDOM, GENTTROUGH, GENTPEAK, GENTRANDOM, TOBRATROUGH, TOBRAPEAK, TOBRARND, AMIKACINPEAK, AMIKACINTROU, AMIKACIN in the last 72 hours.   Microbiology: No results found for this or any previous visit (from the past 720 hour(s)).  Medical History: Past Medical History  Diagnosis Date  . Fibromyalgia   . Chronic pain syndrome   . Hypothyroid   . Psychiatric disorder   . CTS (carpal tunnel syndrome)   . Acne   . Eczema   . Edema   . Hyperlipidemia   . History of asthma   . Allergy   .  GERD (gastroesophageal reflux disease)   . Depression   . Asthma   . Clotting disorder     factor V leiden   . Pulmonary embolus 2000  . Pulmonary emboli     post op tubal    Medications:  Anti-infectives    Start     Dose/Rate Route Frequency Ordered Stop   07/08/14 0600  vancomycin (VANCOCIN) IVPB 750 mg/150 ml premix     750 mg150 mL/hr over 60 Minutes Intravenous Every 48 hours 07/06/14 0625     07/06/14 1200  piperacillin-tazobactam (ZOSYN) IVPB 2.25 g     2.25 g100 mL/hr over 30 Minutes Intravenous 4 times per day 07/06/14 0625     07/06/14 0114  piperacillin-tazobactam (ZOSYN) 3.375 GM/50ML IVPB    Comments:  Deon Pilling   : cabinet override      07/06/14 0114 07/06/14 0115   07/06/14 0114  vancomycin (VANCOCIN) 1 GM/200ML IVPB    Comments:  Deon Pilling   : cabinet override      07/06/14 0114 07/06/14 0115     Assessment: Patient with Sepsis syndrome with left shift, renal failure, hypotension and cellulitis of lower extremities.  First dose of antibiotics already given in ED.  Goal of Therapy:  Vancomycin trough level 15-20 mcg/ml  Zosyn based on renal function   Plan:  Measure antibiotic drug levels at steady state Follow up culture results Vancomycin 750mg  iv q48hr  Zosyn 2.25g IV Q6H  Nani Skillern Crowford 07/06/2014,6:26 AM

## 2014-07-06 NOTE — Progress Notes (Signed)
Utilization review completed.  

## 2014-07-07 ENCOUNTER — Inpatient Hospital Stay (HOSPITAL_COMMUNITY): Payer: Medicare HMO

## 2014-07-07 DIAGNOSIS — Z515 Encounter for palliative care: Secondary | ICD-10-CM

## 2014-07-07 DIAGNOSIS — N179 Acute kidney failure, unspecified: Secondary | ICD-10-CM

## 2014-07-07 LAB — BLOOD GAS, ARTERIAL
Acid-base deficit: 16.9 mmol/L — ABNORMAL HIGH (ref 0.0–2.0)
Bicarbonate: 12.5 mEq/L — ABNORMAL LOW (ref 20.0–24.0)
Drawn by: 43098
FIO2: 1 %
O2 Saturation: 98.2 %
Patient temperature: 98.6
TCO2: 12.4 mmol/L (ref 0–100)
pCO2 arterial: 44.5 mmHg (ref 35.0–45.0)
pH, Arterial: 7.076 — CL (ref 7.350–7.450)
pO2, Arterial: 175 mmHg — ABNORMAL HIGH (ref 80.0–100.0)

## 2014-07-07 LAB — CBC
HCT: 33.3 % — ABNORMAL LOW (ref 36.0–46.0)
Hemoglobin: 11.4 g/dL — ABNORMAL LOW (ref 12.0–15.0)
MCH: 37.1 pg — AB (ref 26.0–34.0)
MCHC: 34.2 g/dL (ref 30.0–36.0)
MCV: 108.5 fL — AB (ref 78.0–100.0)
Platelets: 170 10*3/uL (ref 150–400)
RBC: 3.07 MIL/uL — AB (ref 3.87–5.11)
RDW: 14 % (ref 11.5–15.5)
WBC: 22.9 10*3/uL — ABNORMAL HIGH (ref 4.0–10.5)

## 2014-07-07 LAB — BASIC METABOLIC PANEL
Anion gap: 15 (ref 5–15)
BUN: 29 mg/dL — ABNORMAL HIGH (ref 6–23)
CO2: 13 mmol/L — ABNORMAL LOW (ref 19–32)
CREATININE: 2.7 mg/dL — AB (ref 0.50–1.10)
Calcium: 6.8 mg/dL — ABNORMAL LOW (ref 8.4–10.5)
Chloride: 99 mEq/L (ref 96–112)
GFR, EST AFRICAN AMERICAN: 21 mL/min — AB (ref >90)
GFR, EST NON AFRICAN AMERICAN: 18 mL/min — AB (ref >90)
GLUCOSE: 220 mg/dL — AB (ref 70–99)
Potassium: 4.4 mmol/L (ref 3.5–5.1)
Sodium: 127 mmol/L — ABNORMAL LOW (ref 135–145)

## 2014-07-07 LAB — GLUCOSE, CAPILLARY: GLUCOSE-CAPILLARY: 157 mg/dL — AB (ref 70–99)

## 2014-07-07 LAB — PROCALCITONIN: Procalcitonin: 6.45 ng/mL

## 2014-07-07 LAB — LACTIC ACID, PLASMA: Lactic Acid, Venous: 6.6 mmol/L — ABNORMAL HIGH (ref 0.5–2.2)

## 2014-07-07 MED ORDER — CETYLPYRIDINIUM CHLORIDE 0.05 % MT LIQD
7.0000 mL | Freq: Two times a day (BID) | OROMUCOSAL | Status: DC
  Administered 2014-07-07 (×2): 7 mL via OROMUCOSAL

## 2014-07-07 MED ORDER — FUROSEMIDE 10 MG/ML IJ SOLN
40.0000 mg | Freq: Once | INTRAMUSCULAR | Status: AC
  Administered 2014-07-07: 40 mg via INTRAVENOUS
  Filled 2014-07-07: qty 4

## 2014-07-07 MED ORDER — LORAZEPAM 2 MG/ML IJ SOLN
1.0000 mg | INTRAMUSCULAR | Status: DC | PRN
Start: 2014-07-07 — End: 2014-07-08

## 2014-07-07 MED ORDER — INSULIN ASPART 100 UNIT/ML ~~LOC~~ SOLN: 0.0000 [IU] | Freq: Three times a day (TID) | SUBCUTANEOUS | Status: DC

## 2014-07-07 MED ORDER — MORPHINE BOLUS VIA INFUSION
2.0000 mg | INTRAVENOUS | Status: DC | PRN
Start: 2014-07-07 — End: 2014-07-08
  Filled 2014-07-07: qty 2

## 2014-07-07 MED ORDER — SCOPOLAMINE 1 MG/3DAYS TD PT72
1.0000 | MEDICATED_PATCH | TRANSDERMAL | Status: DC
  Filled 2014-07-07: qty 1

## 2014-07-07 MED ORDER — ATROPINE SULFATE 1 % OP SOLN
2.0000 [drp] | OPHTHALMIC | Status: DC | PRN
  Filled 2014-07-07: qty 2

## 2014-07-07 MED ORDER — SODIUM CHLORIDE 0.9 % IV SOLN
1.0000 mg/h | INTRAVENOUS | Status: DC
  Administered 2014-07-07: 1 mg/h via INTRAVENOUS
  Filled 2014-07-07: qty 10

## 2014-07-07 MED ORDER — LORAZEPAM 2 MG/ML IJ SOLN
0.5000 mg | INTRAMUSCULAR | Status: DC | PRN
  Administered 2014-07-07 (×2): 0.5 mg via INTRAVENOUS
  Filled 2014-07-07: qty 1

## 2014-07-07 MED ORDER — LORAZEPAM 2 MG/ML IJ SOLN
INTRAMUSCULAR | Status: AC
  Administered 2014-07-07: 0.5 mg via INTRAVENOUS
  Filled 2014-07-07: qty 1

## 2014-07-07 MED ORDER — CHLORHEXIDINE GLUCONATE 0.12 % MT SOLN: 15.0000 mL | Freq: Two times a day (BID) | OROMUCOSAL | Status: DC

## 2014-07-12 LAB — CULTURE, BLOOD (ROUTINE X 2)
CULTURE: NO GROWTH
CULTURE: NO GROWTH

## 2014-07-12 NOTE — Discharge Summary (Signed)
Death Summary  Morgan Kelly PYP:[id number] DOB: 08-19-1950 DOA: 2014-07-12  PCP: Leandrew Koyanagi, MD PCP/Office notified:   Admit date: 07-12-14 Date of Death: Jul 14, 2014 at 7:54 pm   Brief narrative: 64 y.o. Female with chronic pain syndrome, fibromyalgia and hypothyroidism presented with several weeks duration of progressively worsening LE pain and swelling, erythema, drainage from several areas in LE's, TTP. 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 Sister also explained that pt has been very confused at home with poor oral intake. Sister confirms DNR status and explains that pt does not want any aggressive interventions. TRH asked to admit to ICU, was placed on Levophed for hypotension.   Major events since admission: 07/12/22 - started on levophed due to hypotension, sepsis 1/13 - off levo and started on neo for pressure support as no central line, only peripheral line, d/w PCCM, CT LE recommended 07-14-2022 - per POA (sister), no CT wished per pt to ensure comfort at this time, PCT consult requested, pt refusing BiPAP  Assessment and Plan:    Active Problems:  Severe Sepsis secondary to bilateral LE cellulitis - still with significant erythema LLE > RLL, no signs of osteo on left foot xray and pt refusing CT of the LE's - BP still soft and unable to wean off neo, requiring BiPAP and frequent boluses to maintain BP  - pt not responding to ABX and IVF, family wanted to ensure comfort by all means and pt expired at 7:54 pm Jul 14, 2013   Acute hypoxic respiratory failure - pH 7 on ABG this AM - secondary to sepsis and progressive pulmonary vascular congestion as noted on CXR - from IVF pt is receiving due to hypotension  - family and pt opted for full  comfort and taken off BiPAP per her wishes   Acute encephalopathy - secondary to the above, severe sepsis   Acute renal failure with metabolic acidosis  - secondary to pre renal etiology, Cr continues trending down but HCO3 is worse  - no further blood work desired per pt and family   LE cellulitis with wounds  - two wounds on left dorsal foot: Proximal 1 x 2 cm, yellow fluid- filled blister, distal 0.5 x 3 cm, also fluid-filled blister - wound care consult requested, appreciate input  - worse and not responding to ABX and IVF treatment   Overt Hypothyroidism, ? Myxedema  - TSH > 90, pt on synthroid 200 mcg at home and reports compliance (sister at bedside says pt not very compliant) - stress dose steroids and synthroid provided but no clinical improvement   Hyponatremia - likely pre renal secondary to sepsis  Hyperkalemia - secondary to ARF ?  Transaminitis with hyperbilirubinemia  - from sepsis, shock liver  Severe PCM  - unable to take anything PO at this time, allow comfort feeding if pt desires  - PCT consulted, appreciate assistance   Hyperglycemia - steroid induced  Code Status: DNR status confirmed with sister (says pt very clear she wants no aggressive interventions other than current regimen) Family Communication: Sister at bedside  IV access:   Peripheral IV Procedures and diagnostic studies;  CXR 07-12-2014 Lungs hypoexpanded, with minimal left basilar atelectasis.  Medical consultants:   PCT  Other consultants:  None  Anti-infectives:   Vancomycin 1/12 --> Zosyn 1/12 -->  Signed:  MAGICK-Charisse Wendell  [facility] 07/12/2014, 8:33 PM [phone]

## 2014-07-25 NOTE — Progress Notes (Signed)
Appreciate Dr. Delanna Ahmadi assistance!  Faye Ramsay, MD  [facility] Pager [phone]  If 7PM-7AM, please contact night-coverage [email or web address] Password [email or web address]

## 2014-07-25 NOTE — Progress Notes (Signed)
PT is not on BiPAP at this time- appears restful.

## 2014-07-25 NOTE — Progress Notes (Addendum)
Pt placed on comfort measures as per MD order. Sister at bedside. Pt passed at 1917. MD Doyle Askew immediately notified.  2 Rns verified absence of heart and lung sounds after auscultaion for one minute. Not candidate for donor services. Bed placement and morgue will be notified when family completes visit.     C. Suella Broad, RN K. Rosana Berger, RN

## 2014-07-25 NOTE — Progress Notes (Signed)
PT remains off BiPAP- RN aware. PT appears restful at this time. Sister at bedside.

## 2014-07-25 NOTE — Progress Notes (Signed)
Pt refusing Bipap and sister (POA) is in agreement. Bipap removed and pt placed on [place]. Pt resting more comfortably at this time. MD paged to be notified of bipap refusal and to get clarification if ABG is still needed. Awaiting MD call back.

## 2014-07-25 NOTE — Progress Notes (Signed)
RN reiterated Bipap leak and consequences as explained by RT. Sister continues want to keep bipap as is.

## 2014-07-25 NOTE — Progress Notes (Signed)
[place] Progress Note Patient Name: Morgan Kelly DOB: Sep 14, 1950 MRN: [id number]   Date of Service  2014-07-28  HPI/Events of Note  anxiety  eICU Interventions  Ativan prn     Intervention Category Minor Interventions: Agitation / anxiety - evaluation and management  Amalie Koran 07/28/2014, 1:46 AM

## 2014-07-25 NOTE — Progress Notes (Signed)
Morhpine drip wasted in sink with C. Suella Broad, RN and Sonnie Alamo, RN.  200cc of 1mg /cc Morphine of 250cc bag wasted.

## 2014-07-25 NOTE — Progress Notes (Signed)
Per RN sister (of PT) stated that she would like to remove PT from BiPAP (RN states that PT is attempting to remove mask). RN removed PT from BiPAP and placed PT on [place].RT requested that RN ask about ordered ABG and follow up with RT.
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
## 2014-07-25 NOTE — Progress Notes (Signed)
PT has high leak value while on BiPAP mask (V60). Per sister at bedside she wishes to leave PT alone (finally sleeping) and allow BiPAP to perform as is. RT explained leak value and consequences to PT- sister restates she still wishes to keep circumstances the same. Current Sp02 95, VTi 250-300. RN aware.

## 2014-07-25 NOTE — Progress Notes (Signed)
Event: Notified by RN that pt has had a significant change in status. States pt is less alert w/ increased WOB. She was initially maintaining sats at 98-100% on R/A now sats are in the mid to upper 80's on 4L . She has been transitioned to NRB at 100% and sats are now 94%. PCXR and ABG ordered. NP to bedside. Subjective: Pt answers questions w/ yes or no d/t increased WOB. Per Morgan Stain, RN who went into the room while I was on the unit pt stated "Just let me be". Discussed her increased WOB and current change in status. Pt agrees w/ trial of BiPAP and re-confirms that she does not want to be intubated. Objective: Morgan Kelly is a 64 y/o pt admitted 07/04/2014 for sepsis secondary to BLE cellulitis. Hospitalization has been complicated by ARF, Hypothyroidism, Hyponatremia, Hyperkalemia, transaminitis and severe PCM. She was placed on vasopressors on admission and remains on neo qtt via PIV after refusing CVL. At bedside pt is noted awake and very tachypneic (38-42). BBS diminished w/ audible expiratory wheezes. BP currently 114/82, HR-120's and pt is afebrile. ABG reveals pH-7.07, pC02-44.5, p02-175 and bicarb of 12.5.  Pt placed on BiPAP. Morphine 2 mg given. PCXR pending. Assessment/Plan: 1. Acute respiratory failure w/ hypoxia:  In setting of metabolic acidosis in pt w/ sepsis, ARF and Shock liver. Will repeat lactate. Continue BiPAP until rounding MD can re-address GOC at this point.  Morphine and Ativan as ordered for tachypnea and agitation/restlessness. F/u PCXR and am labs. Discussed pt w/ Dr [place] Bells w/ Morgan Kelly and w/ my colleague Dr Posey Pronto whom are both in agreement w/ plan.  Pt and sister re-confirm DNR status though are in agreement w/ aggressive management for now. Sister Morgan Kelly) is at bedside. At the time of my departure pt appears to be tolerating BiPAP well. Repeat ABG at 0730.  Will continue to monitor closely in ICU.   Jeryl Columbia, NP-C [facility] Pager [phone]

## 2014-07-25 NOTE — Progress Notes (Addendum)
Patient ID: Morgan Kelly, female   DOB: Jun 06, 1951, 64 y.o.   MRN: [id number]  [facility] PROGRESS NOTE  Morgan Kelly:[id number] DOB: July 04, 1950 DOA: 06/25/2014 PCP: Leandrew Koyanagi, MD  Brief narrative: 64 y.o. Female with chronic pain syndrome, fibromyalgia and hypothyroidism presented with several weeks duration of progressively worsening LE pain and swelling, erythema, drainage from several areas in LE's, TTP. 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 Sister also explained that pt has been very confused at home with poor oral intake. Sister confirms DNR status and explains that pt does not want any aggressive interventions. TRH asked to admit to ICU, was placed on Levophed for hypotension.   Major events since admission: 1/12 - started on levophed due to hypotension, sepsis 1/13 - off levo and started on neo for pressure support as no central line, only peripheral line, d/w PCCM, CT LE recommended 1/14 - per POA (sister), no CT wished per pt to ensure comfort at this time, PCT consult requested, pt refusing BiPAP  Assessment and Plan:    Active Problems:  Severe Sepsis secondary to bilateral LE cellulitis - still with significant erythema LLE > RLL, no signs of osteo on left foot xray and pt refusing CT of the LE's - BP still soft and unable to wean off neo, now requiring BiPAP and frequent boluses to maintain BP  - BP is still soft, pt is afebrile but still tachy, WBC up from yesterday likely steroid induced + underlying infection  - continue Vancomycin and Zosyn day #3 for now, will also continue stress dose steroids - sister who is POA asked for PCT consult as pt is retired Merchandiser, retail and is very clear she wishes no additional  interventions    Acute hypoxic respiratory failure - pH 7 on ABG this AM - secondary to sepsis and now progressive pulmonary vascular congestion as noted on CXR - from IVF pt is receiving due to hypotension  - requiring BiPAP but refusing - will respect wishes and keep on [place]  - d/w sister at bedside how difficult situation this is as pt is requiring IVF but is also now with pulmonary congestion - sister is agreeable to PCT to assist with comfort and ? Hospice placement    Acute encephalopathy - secondary to the above, severe sepsis  - still fairly somnolent and weak  Acute renal failure with metabolic acidosis  - secondary to pre renal etiology, Cr continues trending down but HCO3 is worse  - vanc per pharmacy to be renal doses - continue IVF and repeat BMP in AM   LE cellulitis with wounds  - two wounds on left dorsal foot: Proximal 1 x 2 cm, yellow fluid- filled blister, distal 0.5 x 3 cm, also fluid-filled blister - wound care consult requested, appreciate input   Overt Hypothyroidism, ? Myxedema  - TSH > 90, pt on synthroid 200 mcg at home and reports compliance (sister at bedside says pt not very compliant) - continue IVF Synthroid as it was started in ED - stress dose steroids, IVF   Hyponatremia - likely pre renal secondary to sepsis - Na improving   Hyperkalemia - secondary to ARF ? - now WNL  Transaminitis with hyperbilirubinemia  -  from sepsis, shock liver  Severe PCM  - unable to take anything PO at this time, allow comfort feeding if pt desires  - PCT consulted    Hyperglycemia - steroid induced - SSI   DVT prophylaxis  Heparin SQ while pt is in hospital  Code Status: DNR status confirmed with sister (says pt very clear she wants no aggressive interventions other than current regimen) Family Communication: Sister at bedside Disposition Plan: Keep in ICU  IV access:   Peripheral IV Procedures and diagnostic studies;  CXR 07/13/2014 Lungs  hypoexpanded, with minimal left basilar atelectasis.  Medical consultants:   PCT  Other consultants:   None  Anti-infectives:   Vancomycin 1/12 --> Zosyn 1/12 -->  Faye Ramsay, MD  [facility] Pager [phone]  If 7PM-7AM, please contact night-coverage [email or web address] Password [email or web address] 07-25-2014, 11:18 AM   LOS: 2 days   HPI/Subjective: Still lethargic this AM, refusing BiPAP.  Objective: Filed Vitals:   25-Jul-2014 0845 07-25-14 0900 07/25/2014 0915 Jul 25, 2014 0930  BP: 93/37 98/67 86/45  110/73  Pulse: 97 95 95 94  Temp:      TempSrc:      Resp: 20 17 17 26   Height:      Weight:      SpO2: 99% 99% 99% 85%    Intake/Output Summary (Last 24 hours) at July 25, 2014 1118 Last data filed at 2014-07-25 0800  Gross per 24 hour  Intake 5288.38 ml  Output    520 ml  Net 4768.38 ml    Exam:   General:  Pt is somnolent but easy to arouse, in mild distress due to dyspnea, on BiPAP  Cardiovascular: Regular hythm, tachycardic, S1/S2, no murmurs, no rubs, no gallops  Respiratory: Course breath sounds with rales at bases   Abdomen: Soft, non tender, non distended, bowel sounds present, no guarding  Extremities: Bilateral LE edema and erythema, TTP extending to knees bilaterally   Data Reviewed: Basic Metabolic Panel:  Recent Labs Lab 06/26/2014 2118 07/06/14 0640 07/06/14 1619 2014/07/25 0505  NA 128* 128* 122* 127*  K 5.0 5.5* 5.0 4.4  CL 96 96 93* 99  CO2 19 20 18* 13*  GLUCOSE 74 72 355* 220*  BUN 32* 38* 31* 29*  CREATININE 3.01* 3.22* 2.87* 2.70*  CALCIUM 8.2* 7.7* 7.1* 6.8*   Liver Function Tests:  Recent Labs Lab 07/15/2014 2118 07/06/14 1619  AST 86* 71*  ALT 29 22  ALKPHOS 251* 187*  BILITOT 4.1* 4.7*  PROT 6.0 4.8*  ALBUMIN 2.2* 1.7*   CBC:  Recent Labs Lab 06/30/2014 2118 07/06/14 0640 07-25-2014 0505  WBC 10.1 12.2* 22.9*  NEUTROABS 9.0*  --   --   HGB 12.8 11.6* 11.4*  HCT 35.9* 32.1* 33.3*  MCV 106.5* 104.9* 108.5*  PLT 140* 155 170    Cardiac Enzymes:  Recent Labs Lab 07/10/2014 2118  TROPONINI <0.03     Recent Results (from the past 240 hour(s))  Culture, blood (routine x 2)     Status: None (Preliminary result)   Collection Time: 07/06/14  6:40 AM  Result Value Ref Range Status   Specimen Description BLOOD RIGHT ARM  Final   Special Requests BOTTLES DRAWN AEROBIC AND ANAEROBIC 5CC  Final   Culture   Final           BLOOD CULTURE RECEIVED NO GROWTH TO DATE CULTURE WILL BE HELD FOR 5 DAYS BEFORE ISSUING A FINAL NEGATIVE REPORT Performed at [facility]    Report Status PENDING  Incomplete  Culture, blood (routine x 2)     Status: None (Preliminary result)   Collection Time: 07/06/14  6:48 AM  Result Value Ref Range Status   Specimen Description BLOOD RIGHT HAND  Final   Special Requests BOTTLES DRAWN AEROBIC AND ANAEROBIC 6CC  Final   Culture   Final           BLOOD CULTURE RECEIVED NO GROWTH TO DATE CULTURE WILL BE HELD FOR 5 DAYS BEFORE ISSUING A FINAL NEGATIVE REPORT Performed at [facility]    Report Status PENDING  Incomplete     Scheduled Meds: . antiseptic oral rinse  7 mL Mouth Rinse q12n4p  . chlorhexidine  15 mL Mouth Rinse BID  . heparin  5,000 Units Subcutaneous 3 times per day  . hydrocortisone sod succinate (SOLU-CORTEF) inj  50 mg Intravenous Q6H  . levothyroxine  100 mcg Intravenous Daily  . piperacillin-tazobactam (ZOSYN)  IV  2.25 g Intravenous 4 times per day  . sodium chloride  3 mL Intravenous Q12H  . sodium chloride  3 mL Intravenous Q12H  . vancomycin  750 mg Intravenous Q48H   Continuous Infusions: . sodium chloride 150 mL/hr at 07/06/14 2241  . phenylephrine (NEO-SYNEPHRINE) Adult infusion 150 mcg/min (07/12/2014 0943)

## 2014-07-25 NOTE — Progress Notes (Signed)
Pt clinically deteriorating, appears to be dying. Pt already verbalized she wants to be comfortable, PCT consulted, in the meantime with place on Morphine drip to ensure full comfort. Stop NRB for now as well to ensure pt remains comfortable. Stop IV Neosynephrine as well. Sister also in agreement with proceeding with comfort per pt's known wishes.  Faye Ramsay, MD  [facility] Pager [phone]  If 7PM-7AM, please contact night-coverage [email or web address] Password [email or web address]

## 2014-07-25 DEATH — deceased
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
